# Patient Record
Sex: Female | Born: 1990 | Race: Black or African American | Hispanic: No | Marital: Single | Smoking: Never smoker
Health system: Southern US, Community
[De-identification: ages and names within clinical notes are randomized; demographics above are authoritative.]

## PROBLEM LIST (undated history)

## (undated) DIAGNOSIS — L409 Psoriasis, unspecified: Secondary | ICD-10-CM

## (undated) DIAGNOSIS — R7303 Prediabetes: Secondary | ICD-10-CM

## (undated) DIAGNOSIS — L709 Acne, unspecified: Secondary | ICD-10-CM

## (undated) DIAGNOSIS — M549 Dorsalgia, unspecified: Secondary | ICD-10-CM

## (undated) DIAGNOSIS — M76829 Posterior tibial tendinitis, unspecified leg: Secondary | ICD-10-CM

## (undated) HISTORY — PX: NO PAST SURGERIES: SHX2092

## (undated) HISTORY — DX: Posterior tibial tendinitis, unspecified leg: M76.829

## (undated) HISTORY — DX: Acne, unspecified: L70.9

## (undated) HISTORY — DX: Dorsalgia, unspecified: M54.9

## (undated) HISTORY — DX: Prediabetes: R73.03

## (undated) HISTORY — DX: Psoriasis, unspecified: L40.9

---

## 2016-06-23 ENCOUNTER — Ambulatory Visit (INDEPENDENT_AMBULATORY_CARE_PROVIDER_SITE_OTHER): Payer: 59 | Admitting: Podiatry

## 2016-06-23 ENCOUNTER — Ambulatory Visit (INDEPENDENT_AMBULATORY_CARE_PROVIDER_SITE_OTHER): Payer: 59

## 2016-06-23 ENCOUNTER — Encounter: Payer: Self-pay | Admitting: Podiatry

## 2016-06-23 DIAGNOSIS — M76829 Posterior tibial tendinitis, unspecified leg: Secondary | ICD-10-CM

## 2016-06-23 DIAGNOSIS — M2140 Flat foot [pes planus] (acquired), unspecified foot: Secondary | ICD-10-CM | POA: Diagnosis not present

## 2016-06-23 DIAGNOSIS — M779 Enthesopathy, unspecified: Secondary | ICD-10-CM

## 2016-06-23 DIAGNOSIS — M722 Plantar fascial fibromatosis: Secondary | ICD-10-CM

## 2016-06-23 NOTE — Progress Notes (Signed)
   Subjective:    Patient ID: Monica ClanKirstie Burpee, female    DOB: February 09, 1990, 26 y.o.   MRN: [id number]  HPI: 26 year old African American female presents with her father today for chief complaint of pain to her medial foot left greater than right with tenderness for the past several months. She has been seen by another local podiatrist who states that she has tendinitis prescribed anti-inflammatories and an arch pad. She has no history of trauma but does have a history of lymphedema on her mother side. She is on the state that she has some swelling bilaterally as well.    Review of Systems  All other systems reviewed and are negative.      Objective:   Physical Exam: Vital signs are stable alert and oriented 3. Pulses are palpable. Neurologic sensorium is intact. Deep tendon reflexes are intact. Muscle strength +5 over 5 dorsiflexion plantar flexors and inverters everters onto the musculatures intact. She has pain on palpation of the posterior tibial tendon as it courses beneath the medial malleolus extending to the navicular tuberosity. She has mild tenderness on palpation of the plantar fascia at the calcaneal insertion site. Otherwise orthopedic evaluation and x-rays pes planus left greater than right. Does not demonstrate any type of coalition appears to be flexible in nature radiographs confirmed this as well. No open lesions or wounds are noted.        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 Mild lymphedema bilateral more than likely genetic in nature. Mild plantar fasciitis bilateral left greater than right.  Plan: Assess the etiology pathology conservative versus surgical therapies. I expressed to her that she has posterior tibial tendinitis with a flexible flatfoot deformity which would hopefully be amenable to orthotic devices. I will have her back so that Raiford NobleRick can  cast her for orthotics probably will need a deep heel cup and a medial flange. I did not start her on any medications and will possibly do so once the orthotics come in. Should this fail to alleviate her symptoms I would consider an MRI since this appears to be long-standing.

## 2016-07-01 ENCOUNTER — Ambulatory Visit (INDEPENDENT_AMBULATORY_CARE_PROVIDER_SITE_OTHER): Payer: 59 | Admitting: Podiatry

## 2016-07-01 DIAGNOSIS — M779 Enthesopathy, unspecified: Secondary | ICD-10-CM

## 2016-07-01 DIAGNOSIS — M76829 Posterior tibial tendinitis, unspecified leg: Secondary | ICD-10-CM

## 2016-07-01 NOTE — Progress Notes (Signed)
Patient presents today upon recommedation Dr Al [facility]usHyatt [facility] tendonitis/PTTD.  Upon palpation, patient complains of tenderness distal and inferior of medial mal.    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  Goal is provide longitudinal arch support, offer RF stability.   Plan on [employer] to fab CMFO made out of co-poly based upon patients wt (140) and shoe size (7.5).   Rear foot medial skive/post 4*, and FF inversion 4*.  ALSO deep heel cup and cut shell wide.   Patient's insurance cked...will not cover F/O's.  Patient advised the $398 charge and she is putting it on her flex spend account.

## 2016-07-20 ENCOUNTER — Ambulatory Visit: Payer: 59 | Admitting: Podiatry

## 2016-07-22 ENCOUNTER — Ambulatory Visit (INDEPENDENT_AMBULATORY_CARE_PROVIDER_SITE_OTHER): Payer: Managed Care, Other (non HMO) | Admitting: Podiatry

## 2016-07-22 ENCOUNTER — Ambulatory Visit (INDEPENDENT_AMBULATORY_CARE_PROVIDER_SITE_OTHER): Payer: Managed Care, Other (non HMO)

## 2016-07-22 ENCOUNTER — Encounter: Payer: Self-pay | Admitting: Podiatry

## 2016-07-22 DIAGNOSIS — M2140 Flat foot [pes planus] (acquired), unspecified foot: Secondary | ICD-10-CM

## 2016-07-22 DIAGNOSIS — M779 Enthesopathy, unspecified: Secondary | ICD-10-CM

## 2016-07-22 DIAGNOSIS — R52 Pain, unspecified: Secondary | ICD-10-CM

## 2016-07-22 DIAGNOSIS — M76829 Posterior tibial tendinitis, unspecified leg: Secondary | ICD-10-CM

## 2016-07-22 NOTE — Progress Notes (Signed)
This patient presents the office stating that she's had swelling in her left ankle approximately one week ago.  She says that her swelling has gone down and she has had less pain and discomfort.  She says she is 80% better today but still does have pain over her left forefoot.  Patient is a patient of Dr. Al CorpusHyatt and has been diagnosed with PTT D.  She is awaiting the orthotics arrival.  She presents the office today for an evaluation and treatment of her left foot  Neurovascular status intact.  Patient has mild palpable pain noted third metatarsal left foot and third interspace of the left foot.  Patient does have a flexible pes planus foot type, consistent with the posterior tibial tendon dysfunction.  Patient also has mild hallux limitus first MPJ bilaterally.  Plantar fascial pain is elicited  Diagnosis left ankle tendinitis  plantar fasciitis  ROV  discussed condition with patient.  There is no evidence of any clinical signs for which I think I needed an x-ray.  Since this patient has been improving. I recommended she await the arrival of her orthotics and then follow-up with Dr. Al CorpusHyatt after these have been worn.  Patient was offered medication but was not interested at this time.  Compression anklet sock was dispensed for swelling.  Return to clinic when necessary   Helane GuntherGregory Ahriana Gunkel DPM

## 2016-07-23 ENCOUNTER — Ambulatory Visit: Payer: 59 | Admitting: Podiatry

## 2016-07-26 ENCOUNTER — Ambulatory Visit: Payer: Managed Care, Other (non HMO) | Admitting: *Deleted

## 2016-07-26 DIAGNOSIS — M76829 Posterior tibial tendinitis, unspecified leg: Secondary | ICD-10-CM

## 2016-07-26 NOTE — Patient Instructions (Signed)

## 2016-08-02 NOTE — Progress Notes (Signed)
Patient ID: Monica Dunlap, female   DOB: [birth year], 26 y.o.   MRN: [id number]  Patient presents for orthotic pick up.  Verbal and written break in and wear instructions given.  Patient will follow up in 4 weeks if symptoms worsen or fail to improve.

## 2016-08-23 ENCOUNTER — Ambulatory Visit (INDEPENDENT_AMBULATORY_CARE_PROVIDER_SITE_OTHER): Payer: Managed Care, Other (non HMO) | Admitting: Podiatry

## 2016-08-23 ENCOUNTER — Encounter: Payer: Self-pay | Admitting: Podiatry

## 2016-08-23 DIAGNOSIS — M2140 Flat foot [pes planus] (acquired), unspecified foot: Secondary | ICD-10-CM

## 2016-08-23 DIAGNOSIS — M722 Plantar fascial fibromatosis: Secondary | ICD-10-CM

## 2016-08-23 DIAGNOSIS — M76829 Posterior tibial tendinitis, unspecified leg: Secondary | ICD-10-CM

## 2016-08-23 NOTE — Progress Notes (Signed)
She presents today for follow-up of her posterior tibial tendon dysfunction left foot. She states this seems to be doing very well the orthotics seem to be helping they wanted to get swelling and have tingling down underneath my foot radiating out to my toes. She states that sometimes also swells particularly an long car trips.  Objective: Vital signs are stable she is alert and oriented 3 much decrease in erythema and edema to the left foot. She has mild tenderness on palpation posterior tibial tendon no pain on palpation of the plantar fascial calcaneal insertion site.  Assessment pes planus with posterior tibial tendon dysfunction left.  Plan: I highly recommend she continue wearing her orthotics on a regular basis. I discussed with her shoe types today and I'll follow-up with her on an as-needed basis.

## 2017-05-05 ENCOUNTER — Encounter: Payer: Self-pay | Admitting: Podiatry

## 2017-05-05 ENCOUNTER — Ambulatory Visit: Payer: Managed Care, Other (non HMO) | Admitting: Podiatry

## 2017-05-05 DIAGNOSIS — M76829 Posterior tibial tendinitis, unspecified leg: Secondary | ICD-10-CM

## 2017-05-05 MED ORDER — MELOXICAM 15 MG PO TABS: 15.0000 mg | ORAL_TABLET | Freq: Every day | ORAL | 1 refills | Status: DC

## 2017-05-05 NOTE — Progress Notes (Signed)
This patient presents the office follow-up for  tibial tendon dysfunction, left foot.  She says she aggravated this taking of road trip approximately 6 days ago.  She said she experiences significant pain and throbbing during the car trip.  She says she faithfully wears her orthotics in her shoes when she walks.  She says she did not wear her orthotics during her 6 road trip.  Neurovascular status is intact.  Pain noted along the course of the posterior tibial tendon under the medial malleolus left foot.  Minimal swelling noted.    PTTD left foot.  ROV.  Patient was told to continue to use her orthotics, especially during trips.  Prescribed Mobic 15 mg #30 with instructions to take 1 daily.  RTC prn.   Helane GuntherGregory Alexey Rhoads DPM

## 2017-05-20 ENCOUNTER — Telehealth: Payer: Self-pay | Admitting: Podiatry

## 2017-05-20 NOTE — Telephone Encounter (Signed)
The medication that Dr. Stacie Acres prescribed is not helping. I should have seen Dr. Al Corpus instead of Dr. Stacie Acres and I was wondering if Dr. Al Corpus could prescribe me something else? You can reach me at [phone].

## 2017-05-25 NOTE — Telephone Encounter (Signed)
I returned patient call, but unable to leave voice mail.  Voice mail not set up

## 2017-06-29 ENCOUNTER — Ambulatory Visit: Payer: BC Managed Care – PPO | Admitting: Podiatry

## 2017-06-29 ENCOUNTER — Encounter: Payer: Self-pay | Admitting: Podiatry

## 2017-06-29 DIAGNOSIS — M76822 Posterior tibial tendinitis, left leg: Secondary | ICD-10-CM | POA: Diagnosis not present

## 2017-06-29 DIAGNOSIS — M76829 Posterior tibial tendinitis, unspecified leg: Secondary | ICD-10-CM

## 2017-06-29 MED ORDER — METHYLPREDNISOLONE 4 MG PO TBPK: ORAL_TABLET | ORAL | 0 refills | Status: DC

## 2017-06-29 NOTE — Progress Notes (Signed)
She presents today for follow-up of her posterior tibial tendon dysfunction left foot.  States that is doing okay is to have a little annoying tingling and burning right in here she points to the posterior tibial tendon inframalleolar region.  States that the meloxicam really did not help her much at all.  Objective: Vital signs are stable she is alert and oriented x3.  Pulses are palpable.  She has mild tenderness on palpation of the posterior tibial tendon left.  There appears to be some fluctuance at the inframalleolar region.  She has good normal function of the tendon itself.  Assessment: Posterior tibial tendinitis left.  Plan: At this point I injected 2 mg of dexamethasone and local anesthetic after sterile Betadine skin prep to the medial aspect of the left ankle.  Procedure well without complications.  Start her on a Medrol Dosepak.  We will follow-up with her on an as-needed basis.  Did recommend highly that she continue to wear her orthotics when she asked me about sandals I instructed her to purchase a pair of sandals with a deep heel cup and a higher arch.

## 2017-07-03 ENCOUNTER — Other Ambulatory Visit: Payer: Self-pay | Admitting: Podiatry

## 2018-07-26 ENCOUNTER — Encounter: Payer: Self-pay | Admitting: Podiatry

## 2018-07-26 ENCOUNTER — Other Ambulatory Visit: Payer: Self-pay

## 2018-07-26 ENCOUNTER — Ambulatory Visit: Payer: BC Managed Care – PPO | Admitting: Podiatry

## 2018-07-26 VITALS — Temp 97.7°F

## 2018-07-26 DIAGNOSIS — M76829 Posterior tibial tendinitis, unspecified leg: Secondary | ICD-10-CM

## 2018-07-26 DIAGNOSIS — M76822 Posterior tibial tendinitis, left leg: Secondary | ICD-10-CM

## 2018-07-26 NOTE — Progress Notes (Signed)
She presents today for follow-up of her posterior tibial tendinitis states that every summer starts to bother me and I am leaving for the beach this weekend.  Objective: Vital signs are stable she alert and oriented x3.  She has fluctuance on palpation of the posterior tibial tendon of the left ankle.  Still has full function does not appear to be a tear.  Assessment: Pes planus posterior tibial tendon tendinitis.  Plan: Injected the area today with Kenalog and local anesthetic.

## 2018-09-22 ENCOUNTER — Encounter: Payer: Self-pay | Admitting: Emergency Medicine

## 2018-09-22 ENCOUNTER — Other Ambulatory Visit: Payer: Self-pay

## 2018-09-22 ENCOUNTER — Ambulatory Visit
Admission: EM | Admit: 2018-09-22 | Discharge: 2018-09-22 | Disposition: A | Discharge status: Home / Self Care | Payer: BC Managed Care – PPO | Attending: Emergency Medicine | Admitting: Emergency Medicine

## 2018-09-22 DIAGNOSIS — M779 Enthesopathy, unspecified: Secondary | ICD-10-CM

## 2018-09-22 DIAGNOSIS — M778 Other enthesopathies, not elsewhere classified: Secondary | ICD-10-CM

## 2018-09-22 MED ORDER — IBUPROFEN 600 MG PO TABS: 600.0000 mg | ORAL_TABLET | Freq: Four times a day (QID) | ORAL | 0 refills | Status: DC | PRN

## 2018-09-22 MED ORDER — PREDNISONE 10 MG (21) PO TBPK: ORAL_TABLET | ORAL | 0 refills | Status: DC

## 2018-09-22 NOTE — Discharge Instructions (Addendum)
6-day prednisone Dosepak, 600 mg of ibuprofen combined with 1 g of Tylenol 3-4 times a day on a regular basis for the next several days, then as needed.  Try and rest your hand as much as you can.  If not better in a week or 2, follow-up with Dr. Peggye Ley, hand surgeon.

## 2018-09-22 NOTE — ED Provider Notes (Signed)
HPI  SUBJECTIVE:  Monica Dunlap is a right-handed 28 y.o. female who presents with constant daily pain in the webspace between her thumb and index finger, which is worse in the morning.  This is been going on intermittently for the past 6 months but has become constant and daily over the past week she reports decreased grip strength secondary to pain.  Denies true weakness.  Denies numbness, tingling, erythema, bruising, swelling.  She is a Pharmacist, hospital.  She denies any repetitive motions.  She does not recall any trauma.  She has not been lifting weights sleeping on her hand.  It does not wake her up at night.  She has never had symptoms like this before.  She is tried ibuprofen 400 mg twice daily with some improvement in her symptoms.  She also tried rest.  Symptoms are worse in the morning and with opening jars, doors, twisting.  Past medical history negative for diabetes, hand injury, neuropathy.  LMP: 8/26.  Denies the possibility of being pregnant.  PMD: Dr. Iona Beard at [facility]    History reviewed. No pertinent past medical history.  History reviewed. No pertinent surgical history.  Family History  Problem Relation Age of Onset  . Healthy Mother   . Healthy Father     Social History   Tobacco Use  . Smoking status: Never Smoker  . Smokeless tobacco: Never Used  Substance Use Topics  . Alcohol use: Yes  . Drug use: Never    No current facility-administered medications for this encounter.   Current Outpatient Medications:  .  norethindrone-ethinyl estradiol (JUNEL FE,GILDESS FE,LOESTRIN FE) 1-20 MG-MCG tablet, Take 1 tablet by mouth daily., Disp: , Rfl:  .  ibuprofen (ADVIL) 600 MG tablet, Take 1 tablet (600 mg total) by mouth every 6 (six) hours as needed., Disp: 30 tablet, Rfl: 0 .  predniSONE (STERAPRED UNI-PAK 21 TAB) 10 MG (21) TBPK tablet, Dispense one 6 day pack. Take as directed with food., Disp: 21 tablet, Rfl: 0  No Known Allergies   ROS  As noted in HPI.    Physical Exam  BP (!) 136/91 (BP Location: Right Arm)   Pulse 78   Temp 98.9 F (37.2 C) (Oral)   Resp 14   Ht 5\' 5"  (1.651 m)   Wt 62.6 kg   LMP 09/06/2018 (Exact Date)   SpO2 100%   BMI 22.96 kg/m   Constitutional: Well developed, well nourished, no acute distress Eyes:  EOMI, conjunctiva normal bilaterally HENT: Normocephalic, atraumatic,mucus membranes moist Respiratory: Normal inspiratory effort Cardiovascular: Normal rate GI: nondistended skin: No rash, skin intact Musculoskeletal: Right hand: Palm normal appearance no swelling, erythema, increased temperature, bruising.  Positive tenderness along the flexor tendon of the index finger.  No tenderness along the webspace.  No pain with passive extension of the finger.  Mild pain at the MCP joint.  No erythema, swelling at the joint.   Baseline Strength and Sensation  with normal light touch intact for Pt, distal motor and sensation in median/radial/ulnar nerve distribution with CR< 2 secs and pulse intact.  Hand with intact motor strength 5/5 flexion / extension / FDP / FDS against resistance and 2-point discrimination intact at 13mm in all fingers. Skin intact. No signs of trauma.  No palpable cord suggestive of Duptreyen's contracture.  Wrist WNL, nontender.  Finkelstein negative.  No pain with full range of motion of the wrist. Neurologic: Alert & oriented x 3, no focal neuro deficits Psychiatric: Speech and behavior appropriate  ED Course   Medications - No data to display  No orders of the defined types were placed in this encounter.   No results found for this or any previous visit (from the past 24 hour(s)). No results found.  ED Clinical Impression  1. Tendonitis of right hand      ED Assessment/Plan Suspect a tendinitis.  No evidence of infection/tenosynovitis.  Deferring imaging because she has no bony tenderness of the second metacarpal along the top of the hand, tenderness rather seems to be along the  flexor tendon.  Does not appear to be torn. .  Will try a 6-day prednisone Dosepak, 600 mg of ibuprofen combined with 1 g of Tylenol 3-4 times a day on a regular basis for the next several days, then as needed.  If not better in a week or 2, follow-up with Dr. Stephenie AcresSoria, hand surgeon.  Discussed MDM, treatment plan, and plan for follow-up with patient patient agrees with plan.   Meds ordered this encounter  Medications  . predniSONE (STERAPRED UNI-PAK 21 TAB) 10 MG (21) TBPK tablet    Sig: Dispense one 6 day pack. Take as directed with food.    Dispense:  21 tablet    Refill:  0  . ibuprofen (ADVIL) 600 MG tablet    Sig: Take 1 tablet (600 mg total) by mouth every 6 (six) hours as needed.    Dispense:  30 tablet    Refill:  0    *This clinic note was created using Scientist, clinical (histocompatibility and immunogenetics)Dragon dictation software. Therefore, there may be occasional mistakes despite careful proofreading.   ?    Domenick GongMortenson, Draken Farrior, MD 09/22/18 1735

## 2018-09-22 NOTE — ED Triage Notes (Signed)
Patient c/o pain and weakness in her right hand off and on for 6 months.  Patient denies injury or fall.

## 2019-08-09 ENCOUNTER — Ambulatory Visit
Admission: RE | Admit: 2019-08-09 | Discharge: 2019-08-09 | Disposition: A | Discharge status: Home / Self Care | Payer: BC Managed Care – PPO | Source: Ambulatory Visit | Attending: Family Medicine | Admitting: Family Medicine

## 2019-08-09 ENCOUNTER — Other Ambulatory Visit: Payer: Self-pay

## 2019-08-09 VITALS — BP 143/101 | HR 99 | Temp 98.3°F | Resp 16 | Ht 65.0 in | Wt 130.0 lb

## 2019-08-09 DIAGNOSIS — K219 Gastro-esophageal reflux disease without esophagitis: Secondary | ICD-10-CM

## 2019-08-09 DIAGNOSIS — R112 Nausea with vomiting, unspecified: Secondary | ICD-10-CM | POA: Diagnosis not present

## 2019-08-09 LAB — COMPREHENSIVE METABOLIC PANEL
ALT: 11 U/L (ref 0–44)
AST: 17 U/L (ref 15–41)
Albumin: 4.5 g/dL (ref 3.5–5.0)
Alkaline Phosphatase: 42 U/L (ref 38–126)
Anion gap: 7 (ref 5–15)
BUN: 10 mg/dL (ref 6–20)
CO2: 26 mmol/L (ref 22–32)
Calcium: 8.9 mg/dL (ref 8.9–10.3)
Chloride: 107 mmol/L (ref 98–111)
Creatinine, Ser: 0.73 mg/dL (ref 0.44–1.00)
GFR calc Af Amer: >60 mL/min (ref >60)
GFR calc non Af Amer: >60 mL/min (ref >60)
Glucose, Bld: 85 mg/dL (ref 70–99)
Potassium: 3.7 mmol/L (ref 3.5–5.1)
Sodium: 140 mmol/L (ref 135–145)
Total Bilirubin: 0.8 mg/dL (ref 0.3–1.2)
Total Protein: 7.8 g/dL (ref 6.5–8.1)

## 2019-08-09 LAB — CBC WITH DIFFERENTIAL/PLATELET
Abs Immature Granulocytes: 0.02 10*3/uL (ref 0.00–0.07)
Basophils Absolute: 0 10*3/uL (ref 0.0–0.1)
Basophils Relative: 0 %
Eosinophils Absolute: 0.3 10*3/uL (ref 0.0–0.5)
Eosinophils Relative: 4 %
HCT: 35 % — ABNORMAL LOW (ref 36.0–46.0)
Hemoglobin: 11.9 g/dL — ABNORMAL LOW (ref 12.0–15.0)
Immature Granulocytes: 0 %
Lymphocytes Relative: 42 %
Lymphs Abs: 3.2 10*3/uL (ref 0.7–4.0)
MCH: 28.4 pg (ref 26.0–34.0)
MCHC: 34 g/dL (ref 30.0–36.0)
MCV: 83.5 fL (ref 80.0–100.0)
Monocytes Absolute: 0.6 10*3/uL (ref 0.1–1.0)
Monocytes Relative: 8 %
Neutro Abs: 3.6 10*3/uL (ref 1.7–7.7)
Neutrophils Relative %: 46 %
Platelets: 309 10*3/uL (ref 150–400)
RBC: 4.19 MIL/uL (ref 3.87–5.11)
RDW: 13.3 % (ref 11.5–15.5)
WBC: 7.7 10*3/uL (ref 4.0–10.5)
nRBC: 0 % (ref 0.0–0.2)

## 2019-08-09 LAB — PREGNANCY, URINE: Preg Test, Ur: NEGATIVE

## 2019-08-09 LAB — LIPASE, BLOOD: Lipase: 36 U/L (ref 11–51)

## 2019-08-09 MED ORDER — PANTOPRAZOLE SODIUM 40 MG PO TBEC: 40.0000 mg | DELAYED_RELEASE_TABLET | Freq: Every day | ORAL | 0 refills | Status: DC

## 2019-08-09 MED ORDER — ONDANSETRON HCL 4 MG PO TABS: 4.0000 mg | ORAL_TABLET | Freq: Three times a day (TID) | ORAL | 0 refills | Status: DC | PRN

## 2019-08-09 NOTE — Discharge Instructions (Signed)
Medication as prescribed.  Slow introduction of normal foods.  Take care  Dr. Adriana Simas

## 2019-08-09 NOTE — ED Triage Notes (Signed)
Pt c/o n/v that began on Sunday. Reports after eating she feels like her stomach is not settling. While eating continues belching. BM has been "brown/yellowish, not normal looking".

## 2019-08-09 NOTE — ED Provider Notes (Signed)
[facility]    CSN: [id number] Arrival date & time: 08/09/19  1611  History   Chief Complaint Chief Complaint  Patient presents with  . Abdominal Pain   HPI  29 year old female presents with the above complaint.  Patient reports that she has had some upper abdominal discomfort and feels like her stomach is uneasy.  This started on Sunday after she had 1 bout of emesis.  She reports ongoing she has felt okay but continues to have symptoms particularly after she eats.  She states that her bowel movements have been abnormal.  She states that they are abnormally colored but she has no diarrhea or constipation.  Reports her pain is 2/10 in severity.  No relieving factors.  She has been taking Tums without resolution.  No other associated symptoms.  No other complaints.  Home Medications    Prior to Admission medications   Medication Sig Start Date End Date Taking? Authorizing Provider  ibuprofen (ADVIL) 600 MG tablet Take 1 tablet (600 mg total) by mouth every 6 (six) hours as needed. 09/22/18   Domenick Gong, MD  norethindrone-ethinyl estradiol (JUNEL FE,GILDESS FE,LOESTRIN FE) 1-20 MG-MCG tablet Take 1 tablet by mouth daily.    [provider]  ondansetron (ZOFRAN) 4 MG tablet Take 1 tablet (4 mg total) by mouth every 8 (eight) hours as needed for nausea or vomiting. 08/09/19   Tommie Sams, DO  pantoprazole (PROTONIX) 40 MG tablet Take 1 tablet (40 mg total) by mouth daily. 08/09/19   Tommie Sams, DO  predniSONE (STERAPRED UNI-PAK 21 TAB) 10 MG (21) TBPK tablet Dispense one 6 day pack. Take as directed with food. 09/22/18   Domenick Gong, MD  azelastine (ASTELIN) 0.1 % nasal spray USE 1 SPRAY IN BOTH NOSTRILS TWICE A DAY 05/24/16 09/22/18  [provider]    Family History Family History  Problem Relation Age of Onset  . Healthy Mother   . Healthy Father     Social History Social History   Tobacco Use  . Smoking status: Never Smoker  .  Smokeless tobacco: Never Used  Vaping Use  . Vaping Use: Never used  Substance Use Topics  . Alcohol use: Yes  . Drug use: Never     Allergies   Patient has no known allergies.   Review of Systems Review of Systems Per HPI  Physical Exam Triage Vital Signs ED Triage Vitals  Enc Vitals Group     BP 08/09/19 1634 (!) 143/101     Pulse Rate 08/09/19 1634 99     Resp 08/09/19 1634 16     Temp 08/09/19 1634 98.3 F (36.8 C)     Temp src --      SpO2 08/09/19 1634 99 %     Weight 08/09/19 1635 130 lb (59 kg)     Height 08/09/19 1635 5\' 5"  (1.651 m)     Head Circumference --      Peak Flow --      Pain Score 08/09/19 1634 2     Pain Loc --      Pain Edu? --      Excl. in GC? --    Updated Vital Signs BP (!) 143/101   Pulse 99   Temp 98.3 F (36.8 C)   Resp 16   Ht 5\' 5"  (1.651 m)   Wt 59 kg   LMP 07/18/2019 (Exact Date)   SpO2 99%   BMI 21.63 kg/m   Visual Acuity Right  Eye Distance:   Left Eye Distance:   Bilateral Distance:    Right Eye Near:   Left Eye Near:    Bilateral Near:     Physical Exam Vitals and nursing note reviewed.  Constitutional:      General: She is not in acute distress.    Appearance: Normal appearance. She is not ill-appearing.  HENT:     Head: Normocephalic and atraumatic.  Eyes:     General:        Right eye: No discharge.        Left eye: No discharge.     Conjunctiva/sclera: Conjunctivae normal.  Cardiovascular:     Rate and Rhythm: Normal rate and regular rhythm.     Heart sounds: No murmur heard.   Pulmonary:     Effort: Pulmonary effort is normal.     Breath sounds: Normal breath sounds. No wheezing, rhonchi or rales.  Abdominal:     General: There is no distension.     Palpations: Abdomen is soft.     Tenderness: There is no abdominal tenderness.  Neurological:     Mental Status: She is alert.  Psychiatric:        Mood and Affect: Mood normal.        Behavior: Behavior normal.    UC Treatments / Results    Labs (all labs ordered are listed, but only abnormal results are displayed) Labs Reviewed  CBC WITH DIFFERENTIAL/PLATELET - Abnormal; Notable for the following components:      Result Value   Hemoglobin 11.9 (*)    HCT 35.0 (*)    All other components within normal limits  COMPREHENSIVE METABOLIC PANEL  LIPASE, BLOOD  PREGNANCY, URINE    EKG   Radiology No results found.  Procedures Procedures (including critical care time)  Medications Ordered in UC Medications - No data to display  Initial Impression / Assessment and Plan / UC Course  I have reviewed the triage vital signs and the nursing notes.  Pertinent labs & imaging results that were available during my care of the patient were reviewed by me and considered in my medical decision making (see chart for details).    29 year old female presents with nausea and abdominal pain.  Patient complains primarily that her stomach is uneasy.  She has a lot of belching after she eats.  Suspect GERD.  Treating with Protonix.  Zofran as needed for nausea.  Labs unremarkable today.  Final Clinical Impressions(s) / UC Diagnoses   Final diagnoses:  Non-intractable vomiting with nausea, unspecified vomiting type  Gastroesophageal reflux disease without esophagitis     Discharge Instructions     Medication as prescribed.  Slow introduction of normal foods.  Take care  Dr. Adriana Simas    ED Prescriptions    Medication Sig Dispense Auth. Provider   pantoprazole (PROTONIX) 40 MG tablet Take 1 tablet (40 mg total) by mouth daily. 30 tablet Traniyah Hallett G, DO   ondansetron (ZOFRAN) 4 MG tablet Take 1 tablet (4 mg total) by mouth every 8 (eight) hours as needed for nausea or vomiting. 20 tablet Tommie Sams, DO     PDMP not reviewed this encounter.   Tommie Sams, [place] 08/09/19 2101

## 2019-09-01 ENCOUNTER — Other Ambulatory Visit: Payer: Self-pay | Admitting: Family Medicine

## 2019-10-16 ENCOUNTER — Other Ambulatory Visit: Payer: Self-pay

## 2019-10-16 ENCOUNTER — Encounter: Payer: Self-pay | Admitting: Podiatry

## 2019-10-16 ENCOUNTER — Ambulatory Visit: Payer: BC Managed Care – PPO | Admitting: Podiatry

## 2019-10-16 DIAGNOSIS — M76822 Posterior tibial tendinitis, left leg: Secondary | ICD-10-CM

## 2019-10-16 MED ORDER — METHYLPREDNISOLONE 4 MG PO TBPK: ORAL_TABLET | ORAL | 0 refills | Status: DC

## 2019-10-17 NOTE — Progress Notes (Signed)
She presents today states that she could not get any sooner due to her own schedule but is been hurting as far as her posterior tibial tendon she is referring to of her left foot she is tried wearing a brace taking naproxen with some help.  She been wearing her orthotics again.  Objective: Vital signs are stable she is alert and oriented x3.  She has palpable fluctuance within the tendon sheath of the posterior tibial tendon left.  Otherwise she has good inversion against resistance with just mild tenderness.  Peroneal group appears to be normal.  No plantar fasciitis.  Assessment: Posterior tibial tendinitis.  Plan: I injected the area today with 10 mg of Kenalog making sure not to inject into the tendon itself.  I did inject into the tendon sheath and patient noted that he was feeling better prior to leaving the office.  Also started her on a Medrol Dosepak and I will follow-up with her on an as-needed basis.  We may need to consider an MRI if this does not resolve.  She will start back in orthotics since possible.

## 2020-06-25 LAB — HM PAP SMEAR: HM Pap smear: ABNORMAL

## 2021-02-07 ENCOUNTER — Ambulatory Visit
Admission: EM | Admit: 2021-02-07 | Discharge: 2021-02-07 | Disposition: A | Discharge status: Home / Self Care | Payer: BC Managed Care – PPO | Attending: Emergency Medicine | Admitting: Emergency Medicine

## 2021-02-07 ENCOUNTER — Encounter: Payer: Self-pay | Admitting: Emergency Medicine

## 2021-02-07 DIAGNOSIS — J0100 Acute maxillary sinusitis, unspecified: Secondary | ICD-10-CM | POA: Diagnosis not present

## 2021-02-07 MED ORDER — AMOXICILLIN 875 MG PO TABS: 875.0000 mg | ORAL_TABLET | Freq: Two times a day (BID) | ORAL | 0 refills | Status: AC

## 2021-02-07 NOTE — ED Triage Notes (Signed)
Pt here with a 3 week hx of sinus and head congestion and post-nasal drainage. Has tried OTC therapies with no relief.

## 2021-02-07 NOTE — ED Provider Notes (Signed)
Renaldo Fiddler    CSN: [id number] Arrival date & time: 02/07/21  0850      History   Chief Complaint Chief Complaint  Patient presents with   Nasal Congestion   Headache    HPI Monica Dunlap is a 31 y.o. female.  Patient presents with 3-week history of sinus congestion, sinus pressure, postnasal drip, runny nose, cough.  No fever, rash, shortness of breath, vomiting, diarrhea, or other symptoms.  Multiple OTC sinus medications attempted without relief.  The history is provided by the patient.   History reviewed. No pertinent past medical history.  There are no problems to display for this patient.   History reviewed. No pertinent surgical history.  OB History   No obstetric history on file.      Home Medications    Prior to Admission medications   Medication Sig Start Date End Date Taking? Authorizing Provider  amoxicillin (AMOXIL) 875 MG tablet Take 1 tablet (875 mg total) by mouth 2 (two) times daily for 10 days. 02/07/21 02/17/21 Yes Mickie Bail, NP  Adalimumab (HUMIRA PEN) 40 MG/0.4ML PNKT  08/06/19   [provider]  HUMIRA PEN-PSOR/UVEIT STARTER 80 MG/0.8ML & 40MG /0.4ML PNKT Inject into the skin. 07/11/19   [provider]  ibuprofen (ADVIL) 600 MG tablet Take 1 tablet (600 mg total) by mouth every 6 (six) hours as needed. 09/22/18   11/22/18, MD  levonorgestrel (LILETTA, 52 MG,) 19.5 MCG/DAY IUD IUD by Intrauterine route.    [provider]  methylPREDNISolone (MEDROL DOSEPAK) 4 MG TBPK tablet 6 day dose pack - take as directed 10/16/19   Hyatt, Max T, DPM  norethindrone-ethinyl estradiol (JUNEL FE,GILDESS FE,LOESTRIN FE) 1-20 MG-MCG tablet Take 1 tablet by mouth daily.    [provider]  ondansetron (ZOFRAN) 4 MG tablet Take 1 tablet (4 mg total) by mouth every 8 (eight) hours as needed for nausea or vomiting. 08/09/19   08/11/19, DO  pantoprazole (PROTONIX) 40 MG tablet Take 1 tablet (40 mg total) by mouth  daily. 08/09/19   08/11/19, DO  azelastine (ASTELIN) 0.1 % nasal spray USE 1 SPRAY IN BOTH NOSTRILS TWICE A DAY 05/24/16 09/22/18  [provider]    Family History Family History  Problem Relation Age of Onset   Healthy Mother    Healthy Father     Social History Social History   Tobacco Use   Smoking status: Never   Smokeless tobacco: Never  Vaping Use   Vaping Use: Never used  Substance Use Topics   Alcohol use: Yes   Drug use: Never     Allergies   Patient has no known allergies.   Review of Systems Review of Systems  Constitutional:  Negative for chills and fever.  HENT:  Positive for congestion, postnasal drip, rhinorrhea and sinus pressure. Negative for ear pain and sore throat.   Respiratory:  Positive for cough. Negative for shortness of breath.   Cardiovascular:  Negative for chest pain and palpitations.  Gastrointestinal:  Negative for diarrhea and vomiting.  Skin:  Negative for color change and rash.  All other systems reviewed and are negative.   Physical Exam Triage Vital Signs ED Triage Vitals [02/07/21 0909]  Enc Vitals Group     BP (!) 133/92     Pulse Rate 83     Resp 18     Temp 98.9 F (37.2 C)     Temp Source Oral     SpO2 99 %  Weight      Height      Head Circumference      Peak Flow      Pain Score      Pain Loc      Pain Edu?      Excl. in GC?    No data found.  Updated Vital Signs BP (!) 133/92 (BP Location: Left Arm)    Pulse 83    Temp 98.9 F (37.2 C) (Oral)    Resp 18    SpO2 99%   Visual Acuity Right Eye Distance:   Left Eye Distance:   Bilateral Distance:    Right Eye Near:   Left Eye Near:    Bilateral Near:     Physical Exam Vitals and nursing note reviewed.  Constitutional:      General: She is not in acute distress.    Appearance: She is well-developed.  HENT:     Right Ear: Tympanic membrane normal.     Left Ear: Tympanic membrane normal.     Nose: Congestion present.      Mouth/Throat:     Mouth: Mucous membranes are moist.     Pharynx: Oropharynx is clear.  Cardiovascular:     Rate and Rhythm: Normal rate and regular rhythm.     Heart sounds: Normal heart sounds.  Pulmonary:     Effort: Pulmonary effort is normal. No respiratory distress.     Breath sounds: Normal breath sounds.  Musculoskeletal:     Cervical back: Neck supple.  Skin:    General: Skin is warm and dry.  Neurological:     Mental Status: She is alert.  Psychiatric:        Mood and Affect: Mood normal.        Behavior: Behavior normal.     UC Treatments / Results  Labs (all labs ordered are listed, but only abnormal results are displayed) Labs Reviewed - No data to display  EKG   Radiology No results found.  Procedures Procedures (including critical care time)  Medications Ordered in UC Medications - No data to display  Initial Impression / Assessment and Plan / UC Course  I have reviewed the triage vital signs and the nursing notes.  Pertinent labs & imaging results that were available during my care of the patient were reviewed by me and considered in my medical decision making (see chart for details).    Acute sinusitis.  Patient has been symptomatic for 3 weeks and is not improving with OTC treatment.  Treating today with amoxicillin.  Discussed Mucinex and ibuprofen.  Instructed her to follow-up with her PCP if she is not improving.  She agrees to plan of care.  Final Clinical Impressions(s) / UC Diagnoses   Final diagnoses:  Acute non-recurrent maxillary sinusitis     Discharge Instructions      Take the amoxicillin as directed.  Follow up with your primary care provider if your symptoms are not improving.        ED Prescriptions     Medication Sig Dispense Auth. Provider   amoxicillin (AMOXIL) 875 MG tablet Take 1 tablet (875 mg total) by mouth 2 (two) times daily for 10 days. 20 tablet Mickie Bail, NP      PDMP not reviewed this encounter.    Mickie Bail, NP 02/07/21 1000

## 2021-02-07 NOTE — Discharge Instructions (Addendum)
Take the amoxicillin as directed.  Follow up with your primary care provider if your symptoms are not improving.   ° ° °

## 2021-03-18 ENCOUNTER — Encounter: Payer: Self-pay | Admitting: Internal Medicine

## 2021-03-18 ENCOUNTER — Ambulatory Visit: Payer: BC Managed Care – PPO | Admitting: Internal Medicine

## 2021-03-18 ENCOUNTER — Other Ambulatory Visit: Payer: Self-pay

## 2021-03-18 VITALS — BP 132/80 | HR 85 | Temp 98.4°F | Ht 64.57 in | Wt 137.8 lb

## 2021-03-18 DIAGNOSIS — Z13220 Encounter for screening for lipoid disorders: Secondary | ICD-10-CM

## 2021-03-18 DIAGNOSIS — E559 Vitamin D deficiency, unspecified: Secondary | ICD-10-CM

## 2021-03-18 DIAGNOSIS — Z1329 Encounter for screening for other suspected endocrine disorder: Secondary | ICD-10-CM

## 2021-03-18 DIAGNOSIS — E538 Deficiency of other specified B group vitamins: Secondary | ICD-10-CM

## 2021-03-18 DIAGNOSIS — Z0184 Encounter for antibody response examination: Secondary | ICD-10-CM

## 2021-03-18 DIAGNOSIS — R202 Paresthesia of skin: Secondary | ICD-10-CM

## 2021-03-18 DIAGNOSIS — Z1389 Encounter for screening for other disorder: Secondary | ICD-10-CM

## 2021-03-18 DIAGNOSIS — L409 Psoriasis, unspecified: Secondary | ICD-10-CM

## 2021-03-18 DIAGNOSIS — R7303 Prediabetes: Secondary | ICD-10-CM

## 2021-03-18 DIAGNOSIS — R200 Anesthesia of skin: Secondary | ICD-10-CM

## 2021-03-18 DIAGNOSIS — Z113 Encounter for screening for infections with a predominantly sexual mode of transmission: Secondary | ICD-10-CM

## 2021-03-18 DIAGNOSIS — T148XXA Other injury of unspecified body region, initial encounter: Secondary | ICD-10-CM

## 2021-03-18 DIAGNOSIS — M2550 Pain in unspecified joint: Secondary | ICD-10-CM

## 2021-03-18 DIAGNOSIS — M7752 Other enthesopathy of left foot: Secondary | ICD-10-CM

## 2021-03-18 NOTE — Progress Notes (Signed)
Chief Complaint  ?Patient presents with  ? Establish Care  ? ?New patient  ?1. H/o psoriasis on skyrizi x 1 year 4th dose sees Dr. Evorn Gong has PSA in scalp overall controlled but on left hand middle and left ring finger x 6 months to 1 year but worsening increased freq getting cramping in hand and then blue spot and bruising and sore and hard to open/grip items in the am. She also has numbness and tingling in this area of hand as well joints affected MCP and PCP joint these fingers  ?No heavy menses  ? ?Review of Systems  ?Constitutional:  Negative for weight loss.  ?HENT:  Negative for hearing loss.   ?Eyes:  Negative for blurred vision.  ?Respiratory:  Negative for shortness of breath.   ?Cardiovascular:  Negative for chest pain.  ?Gastrointestinal:  Negative for abdominal pain and blood in stool.  ?Genitourinary:  Negative for dysuria.  ?Musculoskeletal:  Negative for falls and joint pain.  ?Skin:  Negative for rash.  ?Neurological:  Negative for headaches.  ?Psychiatric/Behavioral:  Negative for depression.   ?Past Medical History:  ?Diagnosis Date  ? Acne   ? Back pain   ? Prediabetes   ? Psoriasis   ? PTTD (posterior tibial tendon dysfunction)   ? ?Past Surgical History:  ?Procedure Laterality Date  ? NO PAST SURGERIES    ? ?Family History  ?Problem Relation Age of Onset  ? Hypertension Mother   ? Healthy Mother   ? Hypertension Father   ? Healthy Father   ? Cancer Maternal Grandmother   ?     not a smoker  ? Cancer Paternal Grandmother   ?     multiple myeloma  ? ?Social History  ? ?Socioeconomic History  ? Marital status: Single  ?  Spouse name: Not on file  ? Number of children: Not on file  ? Years of education: Not on file  ? Highest education level: Not on file  ?Occupational History  ? Not on file  ?Tobacco Use  ? Smoking status: Never  ? Smokeless tobacco: Never  ?Vaping Use  ? Vaping Use: Never used  ?Substance and Sexual Activity  ? Alcohol use: Yes  ? Drug use: Never  ? Sexual activity: Yes  ?   Partners: Male  ?Other Topics Concern  ? Not on file  ?Social History Narrative  ? Kindergarden teacher   ? ?Social Determinants of Health  ? ?Financial Resource Strain: Not on file  ?Food Insecurity: Not on file  ?Transportation Needs: Not on file  ?Physical Activity: Not on file  ?Stress: Not on file  ?Social Connections: Not on file  ?Intimate Partner Violence: Not on file  ? ?Current Meds  ?Medication Sig  ? levonorgestrel (LILETTA, 52 MG,) 19.5 MCG/DAY IUD IUD by Intrauterine route.  ? SKYRIZI PEN 150 MG/ML SOAJ Inject into the skin.  ? ?No Known Allergies ?No results found for this or any previous visit (from the past 2160 hour(s)). ?Objective  ?Body mass index is 23.24 kg/m?. ?Wt Readings from Last 3 Encounters:  ?03/18/21 137 lb 12.8 oz (62.5 kg)  ?08/09/19 130 lb (59 kg)  ?09/22/18 138 lb (62.6 kg)  ? ?Temp Readings from Last 3 Encounters:  ?03/18/21 98.4 ?F (36.9 ?C) (Oral)  ?02/07/21 98.9 ?F (37.2 ?C) (Oral)  ?08/09/19 98.3 ?F (36.8 ?C)  ? ?BP Readings from Last 3 Encounters:  ?03/18/21 132/80  ?02/07/21 (!) 133/92  ?08/09/19 (!) 143/101  ? ?Pulse Readings from Last 3  Encounters:  ?03/18/21 85  ?02/07/21 83  ?08/09/19 99  ? ? ?Physical Exam ?Vitals and nursing note reviewed.  ?Constitutional:   ?   Appearance: Normal appearance. She is well-developed and well-groomed.  ?HENT:  ?   Head: Normocephalic and atraumatic.  ?Eyes:  ?   Conjunctiva/sclera: Conjunctivae normal.  ?   Pupils: Pupils are equal, round, and reactive to light.  ?Cardiovascular:  ?   Rate and Rhythm: Normal rate and regular rhythm.  ?   Heart sounds: Normal heart sounds. No murmur heard. ?Pulmonary:  ?   Effort: Pulmonary effort is normal.  ?   Breath sounds: Normal breath sounds.  ?Abdominal:  ?   General: Abdomen is flat. Bowel sounds are normal.  ?   Tenderness: There is no abdominal tenderness.  ?Musculoskeletal:     ?   General: No tenderness.  ?Skin: ?   General: Skin is warm and dry.  ?Neurological:  ?   General: No focal deficit  present.  ?   Mental Status: She is alert and oriented to person, place, and time. Mental status is at baseline.  ?   Cranial Nerves: Cranial nerves 2-12 are intact.  ?   Motor: Motor function is intact.  ?   Coordination: Coordination is intact.  ?   Gait: Gait is intact.  ?Psychiatric:     ?   Attention and Perception: Attention and perception normal.     ?   Mood and Affect: Mood and affect normal.     ?   Speech: Speech normal.     ?   Behavior: Behavior normal. Behavior is cooperative.     ?   Thought Content: Thought content normal.     ?   Cognition and Memory: Cognition and memory normal.     ?   Judgment: Judgment normal.  ? ? ?Assessment  ?Plan  ?Bruising left middle and ring finger - Plan: Protime-INR, APTT, Urinalysis, Routine w reflex microscopic ? ?Prediabetes - Plan: Hemoglobin A1c ?06/2021 ? ?Psoriasis - on skyrizi x 1 year Dr. Evorn Gong ? ?Polyarthralgia/numbness/tingling r/o autoimmune with left hand finger issue see HPI- Plan: Antinuclear Antib (ANA), Sedimentation rate, C-reactive protein, Rheumatoid Factor, Cyclic citrul peptide antibody, IgG ([facility]), CBC with Differential/Platelet, Comprehensive metabolic panel, TSH ? ?B12 deficiency - Plan: Vitamin B12 ? ?Numbness and tingling - Plan: CBC with Differential/Platelet, Comprehensive metabolic panel, TSH, Vitamin B12  ?Consider emg/ncs in the future  ? ?HM ?Flu shot declines  ?Tdap 05/05/17  ?Hpv x 3 utd  ?3 total covid shots pfizer  ? ?Pap 06/25/20 LSIL + HPV Dr. Leafy Ro, 06/2019 ASCUS cant exclude high grade squamous IEL  ? ?Mammo age 69  ? ?Colonoscopy age 21  ? ?Rec healthy diet and exercise  ? ?[facility]  ?[facility]  ?Former PCP [facility] [place]  ?Podiatry Dr. Milinda Pointer [facility] ?Derm Dr. Evorn Gong  ? ? ?Provider: Dr. Olivia Mackie McLean[facility]  ?

## 2021-03-18 NOTE — Patient Instructions (Signed)
Paresthesia °Paresthesia is an abnormal burning or prickling sensation. It is usually felt in the hands, arms, legs, or feet. However, it may occur in any part of the body. Usually, paresthesia is not painful. It may feel like: °Tingling or numbness. °Buzzing. °Itching. °Paresthesia may occur without any clear cause, or it may be caused by: °Breathing too quickly (hyperventilation). °Pressure on a nerve. °An underlying medical condition. °Side effects of a medication. °Nutritional deficiencies. °Exposure to toxic chemicals. °Most people experience temporary (transient) paresthesia at some time in their lives. For some people, it may be long-lasting (chronic) because of an underlying medical condition. If you have paresthesia that lasts a long time, you need to be evaluated by your health care provider. °Follow these instructions at home: °Alcohol use ° °Do not drink alcohol if: °Your health care provider tells you not to drink. °You are pregnant, may be pregnant, or are planning to become pregnant. °If you drink alcohol: °Limit how much you use to: °0-1 drink a day for women. °0-2 drinks a day for men. °Be aware of how much alcohol is in your drink. In the [place] one drink equals one 12 oz bottle of beer (355 mL), one 5 oz glass of wine (148 mL), or one 1½ oz glass of hard liquor (44 mL). °Nutrition ° °Eat a healthy diet. This includes: °Eating foods that are high in fiber, such as fresh fruits and vegetables, whole grains, and beans. °Limiting foods that are high in fat and processed sugars, such as fried or sweet foods. °General instructions °Take over-the-counter and prescription medicines only as told by your health care provider. °Do not use any products that contain nicotine or tobacco, such as cigarettes and e-cigarettes. These can keep blood from reaching damaged nerves. If you need help quitting, ask your health care provider. °If you have diabetes, work closely with your health care provider to keep your  blood sugar under control. °If you have numbness in your feet: °Check every day for signs of injury or infection. Watch for redness, warmth, and swelling. °Wear padded socks and comfortable shoes. These help protect your feet. °Keep all follow-up visits as told by your health care provider. This is important. °Contact a health care provider if you: °Have paresthesia that gets worse or does not go away. °Have numbness after an injury. °Have a burning or prickling feeling that gets worse when you walk. °Have pain, cramps, or dizziness, or you faint. °Develop a rash. °Get help right away if you: °Feel muscle weakness. °Develop new weakness in an arm or leg. °Have trouble walking or moving. °Have problems with speech, understanding, or vision. °Feel confused. °Cannot control your bladder or bowel movements. °Summary °Paresthesia is an abnormal burning or prickling sensation that is usually felt in the hands, arms, legs, or feet. It may also occur in other parts of the body. °Paresthesia may occur without any clear cause, or it may be caused by breathing too quickly (hyperventilation), pressure on a nerve, an underlying medical condition, side effects of a medication, nutritional deficiencies, or exposure to toxic chemicals. °If you have paresthesia that lasts a long time, you need to be evaluated by your health care provider. °This information is not intended to replace advice given to you by your health care provider. Make sure you discuss any questions you have with your health care provider. °Document Revised: 10/09/2019 Document Reviewed: 10/09/2019 °Elsevier Patient Education © 2022 [employer] ° °

## 2021-03-18 NOTE — Addendum Note (Signed)
Addended by: Warden Fillers on: 03/18/2021 03:36 PM ? ? Modules accepted: Orders ? ?

## 2021-03-19 ENCOUNTER — Encounter: Payer: Self-pay | Admitting: Internal Medicine

## 2021-03-19 DIAGNOSIS — E559 Vitamin D deficiency, unspecified: Secondary | ICD-10-CM | POA: Insufficient documentation

## 2021-03-19 LAB — CBC WITH DIFFERENTIAL/PLATELET
Basophils Absolute: 0 10*3/uL (ref 0.0–0.1)
Basophils Relative: 0.2 % (ref 0.0–3.0)
Eosinophils Absolute: 0.4 10*3/uL (ref 0.0–0.7)
Eosinophils Relative: 4.2 % (ref 0.0–5.0)
HCT: 37.7 % (ref 36.0–46.0)
Hemoglobin: 12.4 g/dL (ref 12.0–15.0)
Lymphocytes Relative: 30.7 % (ref 12.0–46.0)
Lymphs Abs: 2.7 10*3/uL (ref 0.7–4.0)
MCHC: 32.9 g/dL (ref 30.0–36.0)
MCV: 84.7 fl (ref 78.0–100.0)
Monocytes Absolute: 0.6 10*3/uL (ref 0.1–1.0)
Monocytes Relative: 6.9 % (ref 3.0–12.0)
Neutro Abs: 5.1 10*3/uL (ref 1.4–7.7)
Neutrophils Relative %: 58 % (ref 43.0–77.0)
Platelets: 359 10*3/uL (ref 150.0–400.0)
RBC: 4.46 Mil/uL (ref 3.87–5.11)
RDW: 13.8 % (ref 11.5–15.5)
WBC: 8.8 10*3/uL (ref 4.0–10.5)

## 2021-03-19 LAB — VITAMIN D 25 HYDROXY (VIT D DEFICIENCY, FRACTURES): VITD: 26.01 ng/mL — ABNORMAL LOW (ref 30.00–100.00)

## 2021-03-19 LAB — COMPREHENSIVE METABOLIC PANEL
ALT: 8 U/L (ref 0–35)
AST: 15 U/L (ref 0–37)
Albumin: 4.8 g/dL (ref 3.5–5.2)
Alkaline Phosphatase: 50 U/L (ref 39–117)
BUN: 13 mg/dL (ref 6–23)
CO2: 27 mEq/L (ref 19–32)
Calcium: 9.8 mg/dL (ref 8.4–10.5)
Chloride: 102 mEq/L (ref 96–112)
Creatinine, Ser: 0.81 mg/dL (ref 0.40–1.20)
GFR: 96.85 mL/min (ref >60.00)
Glucose, Bld: 81 mg/dL (ref 70–99)
Potassium: 4.7 mEq/L (ref 3.5–5.1)
Sodium: 137 mEq/L (ref 135–145)
Total Bilirubin: 0.5 mg/dL (ref 0.2–1.2)
Total Protein: 7.5 g/dL (ref 6.0–8.3)

## 2021-03-19 LAB — URINALYSIS, ROUTINE W REFLEX MICROSCOPIC
Bilirubin Urine: NEGATIVE
Glucose, UA: NEGATIVE
Hgb urine dipstick: NEGATIVE
Ketones, ur: NEGATIVE
Leukocytes,Ua: NEGATIVE
Nitrite: NEGATIVE
Protein, ur: NEGATIVE
Specific Gravity, Urine: 1.011 (ref 1.001–1.035)
pH: 7.5 (ref 5.0–8.0)

## 2021-03-19 LAB — C-REACTIVE PROTEIN: CRP: <1 mg/dL (ref 0.5–20.0)

## 2021-03-19 LAB — TSH: TSH: 1.57 u[IU]/mL (ref 0.35–5.50)

## 2021-03-19 LAB — SEDIMENTATION RATE: Sed Rate: 23 mm/hr — ABNORMAL HIGH (ref 0–20)

## 2021-03-19 LAB — VITAMIN B12: Vitamin B-12: 401 pg/mL (ref 211–911)

## 2021-03-20 LAB — HEPATITIS B SURFACE ANTIBODY, QUANTITATIVE: Hep B S AB Quant (Post): <5 m[IU]/mL — ABNORMAL LOW (ref >=10)

## 2021-03-20 LAB — CYCLIC CITRUL PEPTIDE ANTIBODY, IGG: Cyclic Citrullin Peptide Ab: <16 UNITS

## 2021-03-20 LAB — ANA: Anti Nuclear Antibody (ANA): NEGATIVE

## 2021-03-20 LAB — RHEUMATOID FACTOR: Rheumatoid fact SerPl-aCnc: <14 IU/mL (ref <14)

## 2021-04-23 ENCOUNTER — Encounter: Payer: Self-pay | Admitting: Podiatry

## 2021-04-23 ENCOUNTER — Ambulatory Visit: Payer: BC Managed Care – PPO | Admitting: Podiatry

## 2021-04-23 DIAGNOSIS — M66872 Spontaneous rupture of other tendons, left ankle and foot: Secondary | ICD-10-CM | POA: Diagnosis not present

## 2021-04-23 MED ORDER — METHYLPREDNISOLONE 4 MG PO TBPK: ORAL_TABLET | ORAL | 0 refills | Status: DC

## 2021-04-26 NOTE — Progress Notes (Signed)
She presents today for follow-up of her posterior tibial tendinitis last seen on October 16, 2019.  She states for the most part has been tolerable has been Pat tolerable wearing the brace and the shoes and the orthotics but lately he just cannot seem to be getting any better at all. ? ?Objective: Vital signs are stable she is alert and oriented x3.  Pulses are palpable.  She has moderate edema along the medial aspect of the left ankle and severe tenderness and fluctuance on palpation of the posterior tibial tendon as it courses beneath the medial malleolus extending to the navicular tuberosity.  Exquisitely tender in this area.  She has pain on inversion against resistance. ? ?Assessment: Probable tear posterior tibial tendon conservative therapies have been exhausted. ? ?Plan: Discussed etiology pathology and surgical therapies at this point I highly recommended an MRI for evaluation the posterior tibial tendons integrity posterior tibial tendon differential diagnoses as well as surgical planning.  I will follow-up with her once that comes in. ?

## 2021-05-04 ENCOUNTER — Ambulatory Visit
Admission: RE | Admit: 2021-05-04 | Discharge: 2021-05-04 | Disposition: A | Discharge status: Home / Self Care | Payer: BC Managed Care – PPO | Source: Ambulatory Visit | Attending: Podiatry | Admitting: Podiatry

## 2021-05-04 DIAGNOSIS — M66872 Spontaneous rupture of other tendons, left ankle and foot: Secondary | ICD-10-CM

## 2021-05-08 ENCOUNTER — Telehealth: Payer: Self-pay | Admitting: *Deleted

## 2021-05-08 DIAGNOSIS — M76822 Posterior tibial tendinitis, left leg: Secondary | ICD-10-CM

## 2021-05-08 NOTE — Telephone Encounter (Signed)
-----   Message from Garrel Ridgel, [place] sent at 05/06/2021  7:12 AM EDT ----- ?There is no tear of the tendon.  Pt is going to be the best thing for her.  Could you set that up please. ?

## 2021-06-29 NOTE — Therapy (Signed)
OUTPATIENT PHYSICAL THERAPY LOWER EXTREMITY EVALUATION   Patient Name: Monica Dunlap MRN: [id number] DOB:[birth year], 31 y.o., female Today's Date: 06/30/2021   PT End of Session - 06/30/21 1515     Visit Number 1    Number of Visits 8    Date for PT Re-Evaluation 08/25/21    Authorization Type [employer]    PT Start Time 1500    PT Stop Time 1544    PT Time Calculation (min) 44 min    Activity Tolerance Patient tolerated treatment well    Behavior During Therapy WFL for tasks assessed/performed             Past Medical History:  Diagnosis Date   Acne    Back pain    Prediabetes    Psoriasis    PTTD (posterior tibial tendon dysfunction)    Past Surgical History:  Procedure Laterality Date   NO PAST SURGERIES     Patient Active Problem List   Diagnosis Date Noted   Vitamin D deficiency 03/19/2021   Psoriasis 03/18/2021   Prediabetes 03/18/2021   Numbness and tingling 03/18/2021   Left ankle tendonitis 03/18/2021    PCP: McLean-Scocuzza, Pasty Spillers, MD  REFERRING PROVIDER: Elinor Parkinson, DPM  REFERRING DIAG: [phone] (ICD-10-CM) - Posterior tibial tendinitis of left lower extremity  THERAPY DIAG:  Pain in left ankle and joints of left foot - Plan: PT plan of care cert/re-cert  Muscle weakness (generalized) - Plan: PT plan of care cert/re-cert  Difficulty in walking, not elsewhere classified - Plan: PT plan of care cert/re-cert  Rationale for Evaluation and Treatment Rehabilitation  ONSET DATE: In college around 2012-214  SUBJECTIVE:   SUBJECTIVE STATEMENT: I have a constant swelling in my medial side of ankle. I even wear tennis shoes and it doesn't seem to make a difference.   PERTINENT HISTORY: Back pain, prediabetes, vitamin D deficiency, Psoriasis, PTTD/ left ankle tendonitis  PAIN:  Are you having pain? Yes: NPRS scale: 0/10  at eval because not on feet teaching but at worst  5/10 but during a school work day 3/10 Pain location: Posterior tibialis  insertiion and along muscle between navicular and malleolus Pain description: achy sharp after a heavy work out Aggravating factors: sitting for too long 1 hour or standing for 1 hour,  Relieving factors: sitting and pressure off it Compression sleeve PRECAUTIONS: None  WEIGHT BEARING RESTRICTIONS No  FALLS:  Has patient fallen in last 6 months? No  LIVING ENVIRONMENT: Lives with: lives alone Lives in: House/apartment Stairs: Yes: Internal: 15 steps; on left going up Has following equipment at home: None  OCCUPATION: teacher Kindergarten  PLOF: Independent  PATIENT GOALS I would love to be able to wear shoes other than tennis shoes due to the swelling.  I am still able to exercise but sometimes it hurts when I do too much. When I do a lot of things on my toes I notice that I have more pain in arch and through the arch part.  I am more aware of my pain when I am travelling or sitting or dangling my legs more   OBJECTIVE:   DIAGNOSTIC FINDINGS: IMPRESSION: 05-05-21 Subcutaneous edema along the medial ankle. No focal fluid collection.   Minimal tenosynovitis of the posterior tibial tendon at the level of the talar head/neck and as a passes beneath the medial malleolus. No acute tendon tear.   Intact ankle ligaments.  PATIENT SURVEYS:  FOTO 69% predicted 80  COGNITION:  Overall cognitive status: Within  functional limits for tasks assessed     SENSATION: WFL  EDEMA:  Figure 8: left 52.5 cm and Right is 49.75 cm    POSTURE: rounded shoulders, forward head, and anterior pelvic tilt, slender ectomorphic body type Pt with flattened left arch compared to Right but not collapsed.  PALPATION: TTP over posterior tib from navicular to left lateral malleolus  LOWER EXTREMITY ROM:   WFL  except note  Active ROM Right eval Left eval  Hip flexion    Hip extension    Hip abduction    Hip adduction    Hip internal rotation    Hip external rotation    Knee flexion     Knee extension    Ankle dorsiflexion 11 10  Ankle plantarflexion 61 60  Ankle inversion 41 41  Ankle eversion 30 26   (Blank rows = not tested)  LOWER EXTREMITY MMT:  MMT Right eval Left eval  Hip flexion    Hip extension    Hip abduction    Hip adduction    Hip internal rotation    Hip external rotation    Knee flexion    Knee extension    Ankle dorsiflexion    Ankle plantarflexion SLS 25/25  SLS 10/25   Ankle inversion    Ankle eversion     (Blank rows = not tested)   FUNCTIONAL TESTS:  5 times sit to stand: 6.37 sec   Squat- pt able to squat to about 80 degrees hip flexion but wt bears to Right to unweight left  foot to prevent collapse of medial arch  GAIT: Distance walked: 200 feet  Assistive device utilized: None Level of assistance: Complete Independence Comments: Pt with decreased    TODAY'S TREATMENT: Access Code: [email or web address] URL: [email or web address] Date: 06/30/2021 Prepared by: Garen Lah  Program Notes SOLE to SOLE exercise  Begin with 100 repetitions taking short rest as needed. Over two weeks work up to 300 repetitions continuously  Exercises - Heel Raises with Counter Support  - 1 x daily - 7 x weekly - 5 sets - 10 reps -   hold - Ankle Inversion with Resistance  - 1 x daily - 7 x weekly - 3 sets - 10 reps - Seated Ankle Eversion with Resistance  - 1 x daily - 7 x weekly - 3 sets - 10 reps - Gastroc Stretch on Wall  - 1 x daily - 7 x weekly - 3 reps - 30 sec hold - Soleus Stretch on Wall  - 1 x daily - 7 x weekly - 1 sets - 3 reps - 30 sec hold   PATIENT EDUCATION:  Education details: POC Explanation of findings, FOTO explanation issue HEP Person educated: Patient Education method: Explanation, Demonstration, Tactile cues, Verbal cues, and Handouts Education comprehension: verbalized understanding, returned demonstration, verbal cues required, tactile cues required, and needs further education   HOME EXERCISE  PROGRAM: Access Code: [email or web address] URL: [email or web address] Date: 06/30/2021 Prepared by: Garen Lah  Program Notes SOLE to SOLE exercise  Begin with 100 repetitions taking short rest as needed. 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  She has a history of posterior tendon tendonitis since college in 2012 to 2014.  Pt has partial medial collapse but does not bear weight through navicular of left ankle. Pt does have decreased wt bear through left Great toe first Ray in ambulation.  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   ACTIVITY LIMITATIONS sitting, standing, and stairs  PARTICIPATION LIMITATIONS: driving, occupation, and traveling and sitting for more than 1 hours  PERSONAL FACTORS  long history of PTTD since 2012/2014 without resolving pain  are also affecting patient's functional outcome.   REHAB POTENTIAL: Good  CLINICAL DECISION MAKING: Stable/uncomplicated  EVALUATION COMPLEXITY: Low   GOALS: Goals reviewed with patient? Yes  SHORT TERM GOALS:  Target date: 07/28/2021  Pt will be independent with initial HEP Baseline:no knowledge Goal status: INITIAL  2.  Pt when sitting for  1 hour will not have pain greater than 3/10 Baseline:  5/10 after sitting for 1 hour Goal status: INITIAL  3.  Pt will be more aware of her gait and bearing weight through her 1st ray/great toe ball of foot Baseline:  Pt tends to collapse medial arch when walking Goal status: INITIAL    LONG TERM GOALS: Target date: 08/25/2021   Pt will be independent with advanced HEP including lifting heavier weight  with out increasing pain in medial left arch Baseline:  Pt feels pain in medial arch when lifting more than 25 # Goal status: INITIAL  2.  FOTO will improve from  69%  to  80%   indicating improved functional mobility .  Baseline: Eval 69% Goal status: INITIAL  3.  Pt will be able to tolerate shoes other than flat tennis shoes for 1 hour Baseline: unable to tolerate any shoes besides flat tennis shoes Goal status: INITIAL  4.  Pt will be able to sit and travel for 2 hours with minimal exacerbation of pain Baseline:  5/10 after 1 hour of sitting Goal status: INITIAL  5.  Pt will be able to stand for 2 hours with minimal exacerbation of pain Baseline:  5/10 after 1 hours of standing /teaching Goal status: INITIAL  6.  Pt will be able to SLS on left to 35 x without pain greater than 1/10 Baseline: 10/25  1/10 on eval Goal status: INITIAL   PLAN: PT FREQUENCY: 1x/week  PT DURATION: 8 weeks  PLANNED INTERVENTIONS: Therapeutic exercises, Therapeutic activity, Neuromuscular re-education, Balance training, Gait training, Patient/Family education, Joint mobilization, Stair training, Dry Needling, Electrical stimulation, Cryotherapy, Moist heat, Taping, Vasopneumatic device, Ionotophoresis 4mg /ml Dexamethasone, Manual therapy, and Re-evaluation  PLAN FOR NEXT SESSION: REview HEP, possible TPDN of posterior tibialis  , PT,  ATRIC Certified Exercise Expert for the Aging Adult  06/30/21 5:14  PM Phone: [phone] Fax: [phone]

## 2021-06-30 ENCOUNTER — Ambulatory Visit: Payer: BC Managed Care – PPO | Attending: Podiatry | Admitting: Physical Therapy

## 2021-06-30 DIAGNOSIS — M76822 Posterior tibial tendinitis, left leg: Secondary | ICD-10-CM | POA: Diagnosis not present

## 2021-06-30 DIAGNOSIS — M6281 Muscle weakness (generalized): Secondary | ICD-10-CM | POA: Diagnosis present

## 2021-06-30 DIAGNOSIS — R262 Difficulty in walking, not elsewhere classified: Secondary | ICD-10-CM | POA: Diagnosis present

## 2021-06-30 DIAGNOSIS — M25572 Pain in left ankle and joints of left foot: Secondary | ICD-10-CM | POA: Insufficient documentation

## 2021-07-07 ENCOUNTER — Ambulatory Visit: Payer: BC Managed Care – PPO | Admitting: Physical Therapy

## 2021-07-07 ENCOUNTER — Encounter: Payer: Self-pay | Admitting: Physical Therapy

## 2021-07-07 ENCOUNTER — Other Ambulatory Visit (INDEPENDENT_AMBULATORY_CARE_PROVIDER_SITE_OTHER): Payer: BC Managed Care – PPO

## 2021-07-07 DIAGNOSIS — Z13220 Encounter for screening for lipoid disorders: Secondary | ICD-10-CM | POA: Diagnosis not present

## 2021-07-07 DIAGNOSIS — T148XXA Other injury of unspecified body region, initial encounter: Secondary | ICD-10-CM

## 2021-07-07 DIAGNOSIS — M6281 Muscle weakness (generalized): Secondary | ICD-10-CM

## 2021-07-07 DIAGNOSIS — R262 Difficulty in walking, not elsewhere classified: Secondary | ICD-10-CM

## 2021-07-07 DIAGNOSIS — R7303 Prediabetes: Secondary | ICD-10-CM | POA: Diagnosis not present

## 2021-07-07 DIAGNOSIS — M25572 Pain in left ankle and joints of left foot: Secondary | ICD-10-CM | POA: Diagnosis not present

## 2021-07-07 LAB — LIPID PANEL
Cholesterol: 157 mg/dL (ref 0–200)
HDL: 66.5 mg/dL (ref >39.00)
LDL Cholesterol: 76 mg/dL (ref 0–99)
NonHDL: 90.81
Total CHOL/HDL Ratio: 2
Triglycerides: 72 mg/dL (ref 0.0–149.0)
VLDL: 14.4 mg/dL (ref 0.0–40.0)

## 2021-07-07 LAB — APTT: aPTT: 33.5 s (ref 25.4–36.8)

## 2021-07-07 LAB — PROTIME-INR
INR: 1.1 ratio — ABNORMAL HIGH (ref 0.8–1.0)
Prothrombin Time: 11.7 s (ref 9.6–13.1)

## 2021-07-07 LAB — HEMOGLOBIN A1C: Hgb A1c MFr Bld: 5.8 % (ref 4.6–6.5)

## 2021-07-09 ENCOUNTER — Encounter: Payer: Self-pay | Admitting: Internal Medicine

## 2021-07-09 ENCOUNTER — Ambulatory Visit (INDEPENDENT_AMBULATORY_CARE_PROVIDER_SITE_OTHER): Payer: BC Managed Care – PPO | Admitting: Internal Medicine

## 2021-07-09 VITALS — BP 124/84 | HR 84 | Temp 98.3°F | Resp 14 | Ht 64.57 in | Wt 142.8 lb

## 2021-07-09 DIAGNOSIS — Z0000 Encounter for general adult medical examination without abnormal findings: Secondary | ICD-10-CM

## 2021-07-09 DIAGNOSIS — R7303 Prediabetes: Secondary | ICD-10-CM | POA: Diagnosis not present

## 2021-07-09 NOTE — Progress Notes (Signed)
Chief Complaint  Patient presents with   Follow-up    Denies any concerns or pain.   Annual Exam   annual 1. +prediabetes 5.8 FH mom she will cut down on pasta 2. Bruising stopped but inr sl high 1.1 disc consider hematology in the future but for now hold not bothersome    Review of Systems  Constitutional:  Negative for weight loss.  HENT:  Negative for hearing loss.   Eyes:  Negative for blurred vision.  Respiratory:  Negative for shortness of breath.   Cardiovascular:  Negative for chest pain.  Gastrointestinal:  Negative for abdominal pain and blood in stool.  Genitourinary:  Negative for dysuria.  Musculoskeletal:  Negative for falls and joint pain.  Skin:  Negative for rash.  Neurological:  Negative for headaches.  Psychiatric/Behavioral:  Negative for depression.    Past Medical History:  Diagnosis Date   Acne    Back pain    Prediabetes    Psoriasis    PTTD (posterior tibial tendon dysfunction)    Past Surgical History:  Procedure Laterality Date   NO PAST SURGERIES     Family History  Problem Relation Age of Onset   Hyperlipidemia Mother    Hypertension Mother    Healthy Mother    Other Mother        prediabetes   Hyperlipidemia Father    Hypertension Father    Healthy Father    Hypertension Maternal Grandmother    Cancer Maternal Grandmother        not a smoker   Arthritis Maternal Grandmother    Diabetes Maternal Grandfather    Hypertension Maternal Grandfather    Hypertension Paternal Grandmother    Cancer Paternal Grandmother        multiple myeloma   Hypertension Paternal Grandfather    Social History   Socioeconomic History   Marital status: Single    Spouse name: Not on file   Number of children: Not on file   Years of education: Not on file   Highest education level: Not on file  Occupational History   Not on file  Tobacco Use   Smoking status: Never   Smokeless tobacco: Never  Vaping Use   Vaping Use: Never used  Substance and  Sexual Activity   Alcohol use: Yes   Drug use: Never   Sexual activity: Yes    Partners: Male  Other Topics Concern   Not on file  Social History Narrative   Kindergarden Therapist, occupational and yoga likes   Social Determinants of Health   Financial Resource Strain: Not on file  Food Insecurity: Not on file  Transportation Needs: Not on file  Physical Activity: Not on file  Stress: Not on file  Social Connections: Not on file  Intimate Partner Violence: Not on file   Current Meds  Medication Sig   levonorgestrel (LILETTA, 52 MG,) 19.5 MCG/DAY IUD IUD by Intrauterine route.   SKYRIZI PEN 150 MG/ML SOAJ Inject into the skin.   No Known Allergies Recent Results (from the past 2160 hour(s))  Protime-INR     Status: Abnormal   Collection Time: 07/07/21  7:50 AM  Result Value Ref Range   INR 1.1 (H) 0.8 - 1.0 ratio   Prothrombin Time 11.7 9.6 - 13.1 sec  APTT     Status: None   Collection Time: 07/07/21  7:50 AM  Result Value Ref Range   aPTT 33.5 25.4 - 36.8 SEC  Hemoglobin A1c  Status: None   Collection Time: 07/07/21  7:50 AM  Result Value Ref Range   Hgb A1c MFr Bld 5.8 4.6 - 6.5 %    Comment: Glycemic Control Guidelines for People with Diabetes:Non Diabetic:  <6%Goal of Therapy: <7%Additional Action Suggested:  >8%   Lipid panel     Status: None   Collection Time: 07/07/21  7:50 AM  Result Value Ref Range   Cholesterol 157 0 - 200 mg/dL    Comment: ATP III Classification       Desirable:  < 200 mg/dL               Borderline High:  200 - 239 mg/dL          High:  > = 240 mg/dL   Triglycerides 72.0 0.0 - 149.0 mg/dL    Comment: Normal:  <150 mg/dLBorderline High:  150 - 199 mg/dL   HDL 66.50 >39.00 mg/dL   VLDL 14.4 0.0 - 40.0 mg/dL   LDL Cholesterol 76 0 - 99 mg/dL   Total CHOL/HDL Ratio 2     Comment:                Men          Women1/2 Average Risk     3.4          3.3Average Risk          5.0          4.42X Average Risk          9.6          7.13X Average Risk           15.0          11.0                       NonHDL 90.81     Comment: NOTE:  Non-HDL goal should be 30 mg/dL higher than patient's LDL goal (i.e. LDL goal of < 70 mg/dL, would have non-HDL goal of < 100 mg/dL)   Objective  Body mass index is 24.08 kg/m. Wt Readings from Last 3 Encounters:  07/09/21 142 lb 12.8 oz (64.8 kg)  03/18/21 137 lb 12.8 oz (62.5 kg)  08/09/19 130 lb (59 kg)   Temp Readings from Last 3 Encounters:  07/09/21 98.3 F (36.8 C) (Oral)  03/18/21 98.4 F (36.9 C) (Oral)  02/07/21 98.9 F (37.2 C) (Oral)   BP Readings from Last 3 Encounters:  07/09/21 124/84  03/18/21 132/80  02/07/21 (!) 133/92   Pulse Readings from Last 3 Encounters:  07/09/21 84  03/18/21 85  02/07/21 83    Physical Exam Vitals and nursing note reviewed.  Constitutional:      Appearance: Normal appearance. She is well-developed and well-groomed.  HENT:     Head: Normocephalic and atraumatic.  Eyes:     Conjunctiva/sclera: Conjunctivae normal.     Pupils: Pupils are equal, round, and reactive to light.  Cardiovascular:     Rate and Rhythm: Normal rate and regular rhythm.     Heart sounds: Normal heart sounds. No murmur heard. Pulmonary:     Effort: Pulmonary effort is normal.     Breath sounds: Normal breath sounds.  Abdominal:     General: Abdomen is flat. Bowel sounds are normal.     Tenderness: There is no abdominal tenderness.  Musculoskeletal:        General: No tenderness.  Skin:    General:  Skin is warm and dry.  Neurological:     General: No focal deficit present.     Mental Status: She is alert and oriented to person, place, and time. Mental status is at baseline.     Cranial Nerves: Cranial nerves 2-12 are intact.     Sensory: Sensation is intact.     Motor: Motor function is intact.     Coordination: Coordination is intact.     Gait: Gait is intact.  Psychiatric:        Attention and Perception: Attention and perception normal.        Mood and Affect:  Mood and affect normal.        Speech: Speech normal.        Behavior: Behavior normal. Behavior is cooperative.        Thought Content: Thought content normal.        Cognition and Memory: Cognition and memory normal.        Judgment: Judgment normal.     Assessment  Plan  Annual physical exam  Prediabetes  Lifestyle changes and exercise   HM Flu shot declines  Tdap 05/05/17  Hpv x 3 utd  3 total covid shots pfizer    Pap 06/25/20 LSIL + HPV Dr. Leafy Ro, 06/2019 ASCUS cant exclude high grade squamous IEL  H/o colp   Mammo age 58    Colonoscopy age 42    Rec healthy diet and exercise    [facility]  [facility]  Former PCP [facility] [place]  Podiatry Dr. Milinda Pointer [facility] D[facility] Dr. Evorn Gong     Provider: Dr. Olivia Mackie McLean-Scocuzza-Internal Medicine

## 2021-07-09 NOTE — Patient Instructions (Addendum)
Debrox ear wax cleaning drops   Prediabetes Prediabetes is when your blood sugar (blood glucose) level is higher than normal but not high enough for you to be diagnosed with type 2 diabetes. Having prediabetes puts you at risk for developing type 2 diabetes (type 2 diabetes mellitus). With certain lifestyle changes, you may be able to prevent or delay the onset of type 2 diabetes. This is important because type 2 diabetes can lead to serious complications, such as: Heart disease. Stroke. Blindness. Kidney disease. Depression. Poor circulation in the feet and legs. In severe cases, this could lead to surgical removal of a leg (amputation). What are the causes? The exact cause of prediabetes is not known. It may result from insulin resistance. Insulin resistance develops when cells in the body do not respond properly to insulin that the body makes. This can cause excess glucose to build up in the blood. High blood glucose (hyperglycemia) can develop. What increases the risk? The following factors may make you more likely to develop this condition: You have a family member with type 2 diabetes. You are older than 45 years. You had a temporary form of diabetes during a pregnancy (gestational diabetes). You had polycystic ovary syndrome (PCOS). You are overweight or obese. You are inactive (sedentary). You have a history of heart disease, including problems with cholesterol levels, high levels of blood fats, or high blood pressure. What are the signs or symptoms? You may have no symptoms. If you do have symptoms, they may include: Increased hunger. Increased thirst. Increased urination. Vision changes, such as blurry vision. Tiredness (fatigue). How is this diagnosed? This condition can be diagnosed with blood tests. Your blood glucose may be checked with one or more of the following tests: A fasting blood glucose (FBG) test. You will not be allowed to eat (you will fast) for at least 8  hours before a blood sample is taken. An A1C blood test (hemoglobin A1C). This test provides information about blood glucose levels over the previous 2?3 months. An oral glucose tolerance test (OGTT). This test measures your blood glucose at two points in time: After fasting. This is your baseline level. Two hours after you drink a beverage that contains glucose. You may be diagnosed with prediabetes if: Your FBG is 100?125 mg/dL (4.4-3.1 mmol/L). Your A1C level is 5.7?6.4% (39-46 mmol/mol). Your OGTT result is 140?199 mg/dL (5.4-00 mmol/L). These blood tests may be repeated to confirm your diagnosis. How is this treated? Treatment may include dietary and lifestyle changes to help lower your blood glucose and prevent type 2 diabetes from developing. In some cases, medicine may be prescribed to help lower the risk of type 2 diabetes. Follow these instructions at home: Nutrition  Follow a healthy meal plan. This includes eating lean proteins, whole grains, legumes, fresh fruits and vegetables, low-fat dairy products, and healthy fats. Follow instructions from your health care provider about eating or drinking restrictions. Meet with a dietitian to create a healthy eating plan that is right for you. Lifestyle Do moderate-intensity exercise for at least 30 minutes a day on 5 or more days each week, or as told by your health care provider. A mix of activities may be best, such as: Brisk walking, swimming, biking, and weight lifting. Lose weight as told by your health care provider. Losing 5-7% of your body weight can reverse insulin resistance. Do not drink alcohol if: Your health care provider tells you not to drink. You are pregnant, may be pregnant, or are planning  to become pregnant. If you drink alcohol: Limit how much you use to: 0-1 drink a day for women. 0-2 drinks a day for men. Be aware of how much alcohol is in your drink. In the [place] one drink equals one 12 oz bottle of beer (355  mL), one 5 oz glass of wine (148 mL), or one 1 oz glass of hard liquor (44 mL). General instructions Take over-the-counter and prescription medicines only as told by your health care provider. You may be prescribed medicines that help lower the risk of type 2 diabetes. Do not use any products that contain nicotine or tobacco, such as cigarettes, e-cigarettes, and chewing tobacco. If you need help quitting, ask your health care provider. Keep all follow-up visits. This is important. Where to find more information American Diabetes Association: www.diabetes.org Academy of Nutrition and Dietetics: www.eatright.org American Heart Association: www.heart.org Contact a health care provider if: You have any of these symptoms: Increased hunger. Increased urination. Increased thirst. Fatigue. Vision changes, such as blurry vision. Get help right away if you: Have shortness of breath. Feel confused. Vomit or feel like you may vomit. Summary Prediabetes is when your blood sugar (blood glucose)level is higher than normal but not high enough for you to be diagnosed with type 2 diabetes. Having prediabetes puts you at risk for developing type 2 diabetes (type 2 diabetes mellitus). Make lifestyle changes such as eating a healthy diet and exercising regularly to help prevent diabetes. Lose weight as told by your health care provider. This information is not intended to replace advice given to you by your health care provider. Make sure you discuss any questions you have with your health care provider. Document Revised: 03/29/2019 Document Reviewed: 03/29/2019 Elsevier Patient Education  2023 Elsevier Inc.  Prediabetes Eating Plan Prediabetes is a condition that causes blood sugar (glucose) levels to be higher than normal. This increases the risk for developing type 2 diabetes (type 2 diabetes mellitus). Working with a health care provider or nutrition specialist (dietitian) to make diet and lifestyle  changes can help prevent the onset of diabetes. These changes may help you: Control your blood glucose levels. Improve your cholesterol levels. Manage your blood pressure. What are tips for following this plan? Reading food labels Read food labels to check the amount of fat, salt (sodium), and sugar in prepackaged foods. Avoid foods that have: Saturated fats. Trans fats. Added sugars. Avoid foods that have more than 300 milligrams (mg) of sodium per serving. Limit your sodium intake to less than 2,300 mg each day. Shopping Avoid buying pre-made and processed foods. Avoid buying drinks with added sugar. Cooking Cook with olive oil. Do not use butter, lard, or ghee. Bake, broil, grill, steam, or boil foods. Avoid frying. Meal planning  Work with your dietitian to create an eating plan that is right for you. This may include tracking how many calories you take in each day. Use a food diary, notebook, or mobile application to track what you eat at each meal. Consider following a Mediterranean diet. This includes: Eating several servings of fresh fruits and vegetables each day. Eating fish at least twice a week. Eating one serving each day of whole grains, beans, nuts, and seeds. Using olive oil instead of other fats. Limiting alcohol. Limiting red meat. Using nonfat or low-fat dairy products. Consider following a plant-based diet. This includes dietary choices that focus on eating mostly vegetables and fruit, grains, beans, nuts, and seeds. If you have high blood pressure, you may need  to limit your sodium intake or follow a diet such as the DASH (Dietary Approaches to Stop Hypertension) eating plan. The DASH diet aims to lower high blood pressure. Lifestyle Set weight loss goals with help from your health care team. It is recommended that most people with prediabetes lose 7% of their body weight. Exercise for at least 30 minutes 5 or more days a week. Attend a support group or seek  support from a mental health counselor. Take over-the-counter and prescription medicines only as told by your health care provider. What foods are recommended? Fruits Berries. Bananas. Apples. Oranges. Grapes. Papaya. Mango. Pomegranate. Kiwi. Grapefruit. Cherries. Vegetables Lettuce. Spinach. Peas. Beets. Cauliflower. Cabbage. Broccoli. Carrots. Tomatoes. Squash. Eggplant. Herbs. Peppers. Onions. Cucumbers. Brussels sprouts. Grains Whole grains, such as whole-wheat or whole-grain breads, crackers, cereals, and pasta. Unsweetened oatmeal. Bulgur. Barley. Quinoa. Brown rice. Corn or whole-wheat flour tortillas or taco shells. Meats and other proteins Seafood. Poultry without skin. Lean cuts of pork and beef. Tofu. Eggs. Nuts. Beans. Dairy Low-fat or fat-free dairy products, such as yogurt, cottage cheese, and cheese. Beverages Water. Tea. Coffee. Sugar-free or diet soda. Seltzer water. Low-fat or nonfat milk. Milk alternatives, such as soy or almond milk. Fats and oils Olive oil. Canola oil. Sunflower oil. Grapeseed oil. Avocado. Walnuts. Sweets and desserts Sugar-free or low-fat pudding. Sugar-free or low-fat ice cream and other frozen treats. Seasonings and condiments Herbs. Sodium-free spices. Mustard. Relish. Low-salt, low-sugar ketchup. Low-salt, low-sugar barbecue sauce. Low-fat or fat-free mayonnaise. The items listed above may not be a complete list of recommended foods and beverages. Contact a dietitian for more information. What foods are not recommended? Fruits Fruits canned with syrup. Vegetables Canned vegetables. Frozen vegetables with butter or cream sauce. Grains Refined white flour and flour products, such as bread, pasta, snack foods, and cereals. Meats and other proteins Fatty cuts of meat. Poultry with skin. Breaded or fried meat. Processed meats. Dairy Full-fat yogurt, cheese, or milk. Beverages Sweetened drinks, such as iced tea and soda. Fats and oils Butter.  Lard. Ghee. Sweets and desserts Baked goods, such as cake, cupcakes, pastries, cookies, and cheesecake. Seasonings and condiments Spice mixes with added salt. Ketchup. Barbecue sauce. Mayonnaise. The items listed above may not be a complete list of foods and beverages that are not recommended. Contact a dietitian for more information. Where to find more information American Diabetes Association: www.diabetes.org Summary You may need to make diet and lifestyle changes to help prevent the onset of diabetes. These changes can help you control blood sugar, improve cholesterol levels, and manage blood pressure. Set weight loss goals with help from your health care team. It is recommended that most people with prediabetes lose 7% of their body weight. Consider following a Mediterranean diet. This includes eating plenty of fresh fruits and vegetables, whole grains, beans, nuts, seeds, fish, and low-fat dairy, and using olive oil instead of other fats. This information is not intended to replace advice given to you by your health care provider. Make sure you discuss any questions you have with your health care provider. Document Revised: 03/29/2019 Document Reviewed: 03/29/2019 Elsevier Patient Education  2023 [employer].

## 2021-07-13 NOTE — Therapy (Unsigned)
OUTPATIENT PHYSICAL THERAPY LOWER EXTREMITY EVALUATION   Patient Name: Monica Dunlap MRN: [id number] DOB:[birth year], 31 y.o., female Today's Date: 07/16/2021   PT End of Session - 07/16/21 1110     Visit Number 3    Number of Visits 8    Date for PT Re-Evaluation 08/25/21    Authorization Type [employer]    PT Start Time 1104    PT Stop Time 1145    PT Time Calculation (min) 41 min    Activity Tolerance Patient tolerated treatment well    Behavior During Therapy WFL for tasks assessed/performed               Past Medical History:  Diagnosis Date   Acne    Back pain    Prediabetes    Psoriasis    PTTD (posterior tibial tendon dysfunction)    Past Surgical History:  Procedure Laterality Date   NO PAST SURGERIES     Patient Active Problem List   Diagnosis Date Noted   Vitamin D deficiency 03/19/2021   Psoriasis 03/18/2021   Prediabetes 03/18/2021   Numbness and tingling 03/18/2021   Left ankle tendonitis 03/18/2021    PCP: McLean-Scocuzza, Nino Glow, MD  REFERRING PROVIDER: Garrel Ridgel, DPM  REFERRING DIAG: [phone] (ICD-10-CM) - Posterior tibial tendinitis of left lower extremity  THERAPY DIAG:  Pain in left ankle and joints of left foot  Muscle weakness (generalized)  Difficulty in walking, not elsewhere classified  Rationale for Evaluation and Treatment Rehabilitation  ONSET DATE: In college around 2012-214  SUBJECTIVE:   SUBJECTIVE STATEMENT: Pt reports walking more over the weekend, resulting in increased pain and swelling.   PERTINENT HISTORY: Back pain, prediabetes, vitamin D deficiency, Psoriasis, PTTD/ left ankle tendonitis  PAIN:  Are you having pain? Yes: NPRS scale: 0/10 Pain location: Posterior tibialis insertiion and along muscle between navicular and malleolus Pain description: achy sharp after a heavy work out Aggravating factors: sitting for too long 1 hour or standing for 1 hour,  Relieving factors: sitting and pressure off  it Compression sleeve  PRECAUTIONS: None  WEIGHT BEARING RESTRICTIONS No  FALLS:  Has patient fallen in last 6 months? No  LIVING ENVIRONMENT: Lives with: lives alone Lives in: House/apartment Stairs: Yes: Internal: 15 steps; on left going up Has following equipment at home: None  OCCUPATION: teacher Kindergarten  PLOF: Independent  PATIENT GOALS I would love to be able to wear shoes other than tennis shoes due to the swelling.  I am still able to exercise but sometimes it hurts when I do too much. When I do a lot of things on my toes I notice that I have more pain in arch and through the arch part.  I am more aware of my pain when I am travelling or sitting or dangling my legs more   OBJECTIVE:   DIAGNOSTIC FINDINGS: IMPRESSION: 05-05-21 Subcutaneous edema along the medial ankle. No focal fluid collection.   Minimal tenosynovitis of the posterior tibial tendon at the level of the talar head/neck and as a passes beneath the medial malleolus. No acute tendon tear.   Intact ankle ligaments.  PATIENT SURVEYS:  FOTO 69% predicted 80  COGNITION:  Overall cognitive status: Within functional limits for tasks assessed     SENSATION: WFL  EDEMA:  Figure 8: left 52.5 cm and Right is 49.75 cm  POSTURE: rounded shoulders, forward head, and anterior pelvic tilt, slender ectomorphic body type Pt with flattened left arch compared to Right but not collapsed.  PALPATION: TTP over posterior tib from navicular to left lateral malleolus  LOWER EXTREMITY ROM:   WFL  except note  Active ROM Right eval Left eval  Hip flexion    Hip extension    Hip abduction    Hip adduction    Hip internal rotation    Hip external rotation    Knee flexion    Knee extension    Ankle dorsiflexion 11 10  Ankle plantarflexion 61 60  Ankle inversion 41 41  Ankle eversion 30 26   (Blank rows = not tested)  LOWER EXTREMITY MMT:  MMT Right eval Left eval  Hip flexion    Hip extension     Hip abduction    Hip adduction    Hip internal rotation    Hip external rotation    Knee flexion    Knee extension    Ankle dorsiflexion    Ankle plantarflexion SLS 25/25  SLS 10/25   Ankle inversion    Ankle eversion     (Blank rows = not tested)   FUNCTIONAL TESTS:  5 times sit to stand: 6.37 sec   Squat- pt able to squat to about 80 degrees hip flexion but wt bears to Right to unweight left  foot to prevent collapse of medial arch  GAIT: Distance walked: 200 feet  Assistive device utilized: None Level of assistance: Complete Independence Comments: Pt with decreased  TODAY'S TREATMENT: [facility] Adult PT Treatment:                                                DATE: 07/16/2021 Therapeutic Exercise: NuStep 5 min lvl 4 while taking subjective.  Bilat hip/ Bilat sidelying ER 2 x 15 Calf stretch 2 x 30 sec knee bent/ straight on slant board 4 way ankle with RTB 1x15.  Heel raise with met heads on rolled towel 2 x 15,  Heel raise with ball between ankles to promote posterior tib 1 x 20 Hip Abduction 2 x 10 Bilat  Hip Extension 2 x 10 Bilat Toe Yoga x 15 Bilat   Trigger Point Dry-Needling  Treatment instructions: Expect mild to moderate muscle soreness. S/S of pneumothorax if dry needled over a lung field, and to seek immediate medical attention should they occur. Patient verbalized understanding of these instructions and education.  Patient Consent Given: Yes Education handout provided: Yes Muscles treated: posterior tib and gastroc on the L Electrical stimulation performed: No Parameters: N/A Treatment response/outcome: twitch response and muscle lengthening.   [facility] Adult PT Treatment:                                                DATE: 07/07/2021 Therapeutic Exercise: L hip/ L sidelying ER 2 x 15 Calf stretch 2 x 30 sec knee bent/ straight on slant board Heel raise with met heads on rolled towel 2 x 15,  Heel raise with ball between ankles to promote posterior tib 1 x  20 Manual Therapy: MTPR along the posterior tib, and taught how to perform  Trigger Point Dry-Needling  Treatment instructions: Expect mild to moderate muscle soreness. S/S of pneumothorax if dry needled over a lung field, and to seek immediate medical attention should they occur. Patient verbalized  understanding of these instructions and education.  Patient Consent Given: Yes Education handout provided: Yes Muscles treated: posterior tib on the L Electrical stimulation performed: No Parameters: N/A Treatment response/outcome: twitch response and muscle lengthening.    Access Code: [email or web address] URL: [email or web address] Date: 06/30/2021 Prepared by: Voncille Lo  Program Notes SOLE to SOLE exercise  Begin with 100 repetitions taking short rest as needed. Over two weeks work up to 300 repetitions continuously  Mather with Counter Support  - 1 x daily - 7 x weekly - 5 sets - 10 reps -   hold - Ankle Inversion with Resistance  - 1 x daily - 7 x weekly - 3 sets - 10 reps - Seated Ankle Eversion with Resistance  - 1 x daily - 7 x weekly - 3 sets - 10 reps - Gastroc Stretch on Wall  - 1 x daily - 7 x weekly - 3 reps - 30 sec hold - Soleus Stretch on Wall  - 1 x daily - 7 x weekly - 1 sets - 3 reps - 30 sec hold   PATIENT EDUCATION:  Education details: POC Explanation of findings, FOTO explanation issue HEP Person educated: Patient Education method: Explanation, Demonstration, Tactile cues, Verbal cues, and Handouts Education comprehension: verbalized understanding, returned demonstration, verbal cues required, tactile cues required, and needs further education   HOME EXERCISE PROGRAM: Access Code: [email or web address] URL: [email or web address] Date: 06/30/2021 Prepared by: Voncille Lo  Program Notes SOLE to SOLE exercise  Begin with 100 repetitions taking short rest as needed. Over two weeks work up to 300 repetitions  continuously  Exercises - Heel Raises with Counter Support  - 1 x daily - 7 x weekly - 5 sets - 10 reps -   hold - Ankle Inversion with Resistance  - 1 x daily - 7 x weekly - 3 sets - 10 reps - Seated Ankle Eversion with Resistance  - 1 x daily - 7 x weekly - 3 sets - 10 reps - Gastroc Stretch on Wall  - 1 x daily - 7 x weekly - 3 reps - 30 sec hold - Soleus Stretch on Wall  - 1 x daily - 7 x weekly - 1 sets - 3 reps - 30 sec hold  ASSESSMENT:  CLINICAL IMPRESSION: Pt arrives to session reporting no pain this morning, however reports increased pain and swelling the past few days due to increased walking. Due to positive reports from [facility] last session, continued with it this session focusing on the L posterior tib and gastroc head. No muscle twitches or adverse effects noted. Continued working on posterior tib strengthening with addition of 4 way ankle with resistance. Additionally challenged pt with standing hip abduction and extension to target glutes, and toe yoga to target foot intrinsics. End of session she noted no pain but did report feeling fatigued.   OBJECTIVE IMPAIRMENTS difficulty walking, decreased ROM, decreased strength, and pain.   ACTIVITY LIMITATIONS sitting, standing, and stairs  PARTICIPATION LIMITATIONS: driving, occupation, and traveling and sitting for more than 1 hours  PERSONAL FACTORS  long history of PTTD since 2012/2014 without resolving pain  are also affecting patient's functional outcome.   REHAB POTENTIAL: Good  CLINICAL DECISION MAKING: Stable/uncomplicated  EVALUATION COMPLEXITY: Low   GOALS: Goals reviewed with patient? Yes  SHORT TERM GOALS: Target date: 08/03/2021  Pt will be independent with initial HEP Baseline:no knowledge Goal status: INITIAL  2.  Pt when sitting for 1 hour will not have pain greater  than 3/10 Baseline:  5/10 after sitting for 1 hour Goal status: INITIAL  3.  Pt will be more aware of her gait and bearing weight through  her 1st ray/great toe ball of foot Baseline:  Pt tends to collapse medial arch when walking Goal status: INITIAL    LONG TERM GOALS: Target date: 08/31/2021   Pt will be independent with advanced HEP including lifting heavier weight  with out increasing pain in medial left arch Baseline:  Pt feels pain in medial arch when lifting more than 25 # Goal status: INITIAL  2.  FOTO will improve from  69%  to  80%   indicating improved functional mobility .  Baseline: Eval 69% Goal status: INITIAL  3.  Pt will be able to tolerate shoes other than flat tennis shoes for 1 hour Baseline: unable to tolerate any shoes besides flat tennis shoes Goal status: INITIAL  4.  Pt will be able to sit and travel for 2 hours with minimal exacerbation of pain Baseline:  5/10 after 1 hour of sitting Goal status: INITIAL  5.  Pt will be able to stand for 2 hours with minimal exacerbation of pain Baseline:  5/10 after 1 hours of standing /teaching Goal status: INITIAL  6.  Pt will be able to SLS on left to 35 x without pain greater than 1/10 Baseline: 10/25  1/10 on eval Goal status: INITIAL   PLAN: PT FREQUENCY: 1x/week  PT DURATION: 8 weeks  PLANNED INTERVENTIONS: Therapeutic exercises, Therapeutic activity, Neuromuscular re-education, Balance training, Gait training, Patient/Family education, Joint mobilization, Stair training, Dry Needling, Electrical stimulation, Cryotherapy, Moist heat, Taping, Vasopneumatic device, Ionotophoresis 43m/ml Dexamethasone, Manual therapy, and Re-evaluation  PLAN FOR NEXT SESSION: REview HEP, possible TPDN of posterior tibialis. Add resistance to hip ext/ abd and towel scrunches x 15   SRudi HeapPT, DPT  07/16/21  11:56 AM

## 2021-07-16 ENCOUNTER — Encounter: Payer: Self-pay | Admitting: Physical Therapy

## 2021-07-16 ENCOUNTER — Other Ambulatory Visit: Payer: Self-pay

## 2021-07-16 ENCOUNTER — Ambulatory Visit: Payer: BC Managed Care – PPO | Attending: Podiatry | Admitting: Physical Therapy

## 2021-07-16 DIAGNOSIS — R262 Difficulty in walking, not elsewhere classified: Secondary | ICD-10-CM | POA: Diagnosis present

## 2021-07-16 DIAGNOSIS — M25572 Pain in left ankle and joints of left foot: Secondary | ICD-10-CM | POA: Diagnosis not present

## 2021-07-16 DIAGNOSIS — M6281 Muscle weakness (generalized): Secondary | ICD-10-CM | POA: Insufficient documentation

## 2021-07-21 ENCOUNTER — Encounter: Payer: Self-pay | Admitting: Physical Therapy

## 2021-07-21 ENCOUNTER — Ambulatory Visit: Payer: BC Managed Care – PPO | Admitting: Physical Therapy

## 2021-07-21 DIAGNOSIS — M6281 Muscle weakness (generalized): Secondary | ICD-10-CM

## 2021-07-21 DIAGNOSIS — R262 Difficulty in walking, not elsewhere classified: Secondary | ICD-10-CM

## 2021-07-21 DIAGNOSIS — M25572 Pain in left ankle and joints of left foot: Secondary | ICD-10-CM | POA: Diagnosis not present

## 2021-07-21 NOTE — Therapy (Signed)
OUTPATIENT PHYSICAL THERAPY LOWER EXTREMITY EVALUATION   Patient Name: Monica Dunlap MRN: [id number] DOB:1990/03/28, 31 y.o., female Today's Date: 07/21/2021   PT End of Session - 07/21/21 1025     Visit Number 4    Number of Visits 8    Date for PT Re-Evaluation 08/25/21    Authorization Type BC[employer]    PT Start Time 1017    PT Stop Time 1105    PT Time Calculation (min) 48 min    Activity Tolerance Patient tolerated treatment well    Behavior During Therapy WFL for tasks assessed/performed                Past Medical History:  Diagnosis Date   Acne    Back pain    Prediabetes    Psoriasis    PTTD (posterior tibial tendon dysfunction)    Past Surgical History:  Procedure Laterality Date   NO PAST SURGERIES     Patient Active Problem List   Diagnosis Date Noted   Vitamin D deficiency 03/19/2021   Psoriasis 03/18/2021   Prediabetes 03/18/2021   Numbness and tingling 03/18/2021   Left ankle tendonitis 03/18/2021    PCP: McLean-Scocuzza, Nino Glow, MD  REFERRING PROVIDER: Garrel Ridgel, DPM  REFERRING DIAG: [phone] (ICD-10-CM) - Posterior tibial tendinitis of left lower extremity  THERAPY DIAG:  Pain in left ankle and joints of left foot  Muscle weakness (generalized)  Difficulty in walking, not elsewhere classified  Rationale for Evaluation and Treatment Rehabilitation  ONSET DATE: In college around 2012-214  SUBJECTIVE:   SUBJECTIVE STATEMENT: Pt is 0/10 at rest but if I am up for an hour or more I start having pain in my in my medial ankle 5/10.  I did a pilates class on Saturday and It was really helpful.   PERTINENT HISTORY: Back pain, prediabetes, vitamin D deficiency, Psoriasis, PTTD/ left ankle tendonitis  PAIN:  Are you having pain? Yes: NPRS scale: 0/10 at rest but 5/10 after standing for 1 hour or more /10 Pain location: Posterior tibialis insertiion and along muscle between navicular and malleolus Pain description: achy sharp after a  heavy work out Aggravating factors: sitting for too long 1 hour or standing for 1 hour,  Relieving factors: sitting and pressure off it Compression sleeve  PRECAUTIONS: None  WEIGHT BEARING RESTRICTIONS No  FALLS:  Has patient fallen in last 6 months? No  LIVING ENVIRONMENT: Lives with: lives alone Lives in: House/apartment Stairs: Yes: Internal: 15 steps; on left going up Has following equipment at home: None  OCCUPATION: teacher Kindergarten  PLOF: Independent  PATIENT GOALS I would love to be able to wear shoes other than tennis shoes due to the swelling.  I am still able to exercise but sometimes it hurts when I do too much. When I do a lot of things on my toes I notice that I have more pain in arch and through the arch part.  I am more aware of my pain when I am travelling or sitting or dangling my legs more   OBJECTIVE:   DIAGNOSTIC FINDINGS: IMPRESSION: 05-05-21 Subcutaneous edema along the medial ankle. No focal fluid collection.   Minimal tenosynovitis of the posterior tibial tendon at the level of the talar head/neck and as a passes beneath the medial malleolus. No acute tendon tear.   Intact ankle ligaments.  PATIENT SURVEYS:  FOTO 69% predicted 80  COGNITION:  Overall cognitive status: Within functional limits for tasks assessed     SENSATION:  WFL  EDEMA:  Figure 8: left 52.5 cm and Right is 49.75 cm  POSTURE: rounded shoulders, forward head, and anterior pelvic tilt, slender ectomorphic body type Pt with flattened left arch compared to Right but not collapsed.  PALPATION: TTP over posterior tib from navicular to left lateral malleolus  LOWER EXTREMITY ROM:   WFL  except note  Active ROM Right eval Left eval  Hip flexion    Hip extension    Hip abduction    Hip adduction    Hip internal rotation    Hip external rotation    Knee flexion    Knee extension    Ankle dorsiflexion 11 10  Ankle plantarflexion 61 60  Ankle inversion 41 41   Ankle eversion 30 26   (Blank rows = not tested)  LOWER EXTREMITY MMT:  MMT Right eval Left eval  Hip flexion    Hip extension    Hip abduction    Hip adduction    Hip internal rotation    Hip external rotation    Knee flexion    Knee extension    Ankle dorsiflexion    Ankle plantarflexion SLS 25/25  SLS 10/25   Ankle inversion    Ankle eversion     (Blank rows = not tested)   FUNCTIONAL TESTS:  5 times sit to stand: 6.37 sec   Squat- pt able to squat to about 80 degrees hip flexion but wt bears to Right to unweight left  foot to prevent collapse of medial arch  GAIT: Distance walked: 200 feet  Assistive device utilized: None Level of assistance: Complete Independence Comments: Pt with decreased  TODAY'S TREATMENT: [facility] Adult PT Treatment:                                                DATE: 07-21-21  Reverse clam 2 x 15 on R and L  Calf stretch 2 x 30 sec knee bent/ straight on slant board 4 way ankle with GTB 1x15.  Heel raise with met heads on rolled towel 2 x 15,  Heel raise with ball between ankles to promote posterior tib 1 x 20 Hip Abduction 2 x 10 Bilat  Hip Extension 2 x 10 Bilat Toe Yoga x 15 Bilat  Single limb forward floor touch with 5 lb DB 1 x 12 needs UE support 2 fingers  Trigger Point Dry-Needling  Treatment instructions: Expect mild to moderate muscle soreness. S/S of pneumothorax if dry needled over a lung field, and to seek immediate medical attention should they occur. Patient verbalized understanding of these instructions and education.  Patient Consent Given: Yes Education handout provided: Yes Muscles treated: posterior tib and gastroc on the L Electrical stimulation performed: yes for tib posterior L Parameters: 20 pps to pt tolerance for 10 min to tibialis posterior Left increasing every 2 min for pt tolerance Treatment response/outcome: twitch response and muscle lengthening. With gastroc and L tib post    [facility] Adult PT Treatment:                                                 DATE: 07/16/2021 Therapeutic Exercise: NuStep 5 min lvl 4 while taking subjective.  Bilat hip/ Bilat sidelying  ER 2 x 15 Calf stretch 2 x 30 sec knee bent/ straight on slant board 4 way ankle with RTB 1x15.  Heel raise with met heads on rolled towel 2 x 15,  Heel raise with ball between ankles to promote posterior tib 1 x 20 Hip Abduction 2 x 10 Bilat  Hip Extension 2 x 10 Bilat Toe Yoga x 15 Bilat   Trigger Point Dry-Needling  Treatment instructions: Expect mild to moderate muscle soreness. S/S of pneumothorax if dry needled over a lung field, and to seek immediate medical attention should they occur. Patient verbalized understanding of these instructions and education.  Patient Consent Given: Yes Education handout provided: Yes Muscles treated: posterior tib and gastroc on the L Electrical stimulation performed: No Parameters: N/A Treatment response/outcome: twitch response and muscle lengthening.   [facility] Adult PT Treatment:                                                DATE: 07/07/2021 Therapeutic Exercise: L hip/ L sidelying ER 2 x 15 Calf stretch 2 x 30 sec knee bent/ straight on slant board Heel raise with met heads on rolled towel 2 x 15,  Heel raise with ball between ankles to promote posterior tib 1 x 20 Manual Therapy: MTPR along the posterior tib, and taught how to perform  Trigger Point Dry-Needling  Treatment instructions: Expect mild to moderate muscle soreness. S/S of pneumothorax if dry needled over a lung field, and to seek immediate medical attention should they occur. Patient verbalized understanding of these instructions and education.  Patient Consent Given: Yes Education handout provided: Yes Muscles treated: posterior tib on the L Electrical stimulation performed: No Parameters: N/A Treatment response/outcome: twitch response and muscle lengthening.    Access Code: [email or web address] URL:  [email or web address] Date: 06/30/2021 Prepared by: Voncille Lo  Program Notes SOLE to SOLE exercise  Begin with 100 repetitions taking short rest as needed. Over two weeks work up to 300 repetitions continuously  [place] with Counter Support  - 1 x daily - 7 x weekly - 5 sets - 10 reps -   hold - Ankle Inversion with Resistance  - 1 x daily - 7 x weekly - 3 sets - 10 reps - Seated Ankle Eversion with Resistance  - 1 x daily - 7 x weekly - 3 sets - 10 reps - Gastroc Stretch on Wall  - 1 x daily - 7 x weekly - 3 reps - 30 sec hold - Soleus Stretch on Wall  - 1 x daily - 7 x weekly - 1 sets - 3 reps - 30 sec hold   PATIENT EDUCATION:  Education details: POC Explanation of findings, FOTO explanation issue HEP Person educated: Patient Education method: Explanation, Demonstration, Tactile cues, Verbal cues, and Handouts Education comprehension: verbalized understanding, returned demonstration, verbal cues required, tactile cues required, and needs further education   HOME EXERCISE PROGRAM: Access Code: [email or web address] URL: [email or web address] Date: 07/21/2021 updated Prepared by: Voncille Lo  Program Notes SOLE to SOLE exercise  Begin with 100 repetitions taking short rest as needed. Over two weeks work up to 300 repetitions continuously  [place] with Counter Support  - 1 x daily - 7 x weekly - 5 sets - 10 reps -   hold - Ankle Inversion with Resistance  - 1 x daily -  7 x weekly - 3 sets - 10 reps - Seated Ankle Eversion with Resistance  - 1 x daily - 7 x weekly - 3 sets - 10 reps - Gastroc Stretch on Wall  - 1 x daily - 7 x weekly - 3 reps - 30 sec hold - Soleus Stretch on Wall  - 1 x daily - 7 x weekly - 1 sets - 3 reps - 30 sec hold - Forward Reach with Dumbbell  - 1 x daily - 7 x weekly - 3 sets - 10 reps  ASSESSMENT:  CLINICAL IMPRESSION: Pt arrives to session reporting no pain this morning at rest but does  complain about pain up 5/10 after standing for 1 hour or more.  Pt able to Meet # 1 and # 2 STG's Pt reports walking dog a couple of times a day about 20 minutes each. Pt reports  positive reports from TPDN , continued with it this session focusing on the L posterior tib and gastroc head and then 8 min e stim with TPDN for post tib only. No adverse reactions noted. Continued working on posterior tib  with added single limb stance with Added DB weight to floor.  Pt is a little sore after this session 2-3/10 but able to do exercises.  End of session she noted minimal pain but did report feeling fatigued.   OBJECTIVE IMPAIRMENTS difficulty walking, decreased ROM, decreased strength, and pain.   ACTIVITY LIMITATIONS sitting, standing, and stairs  PARTICIPATION LIMITATIONS: driving, occupation, and traveling and sitting for more than 1 hours  PERSONAL FACTORS  long history of PTTD since 2012/2014 without resolving pain  are also affecting patient's functional outcome.   REHAB POTENTIAL: Good  CLINICAL DECISION MAKING: Stable/uncomplicated  EVALUATION COMPLEXITY: Low   GOALS: Goals reviewed with patient? Yes  SHORT TERM GOALS: Target date: 08/03/2021  Pt will be independent with initial HEP Baseline:no knowledge Goal status: MET  2.  Pt when sitting for 1 hour will not have pain greater than 3/10 Baseline:  5/10 after sitting for 1 hour, 07-21-21 sits and watches show   Goal status: MET  3.  Pt will be more aware of her gait and bearing weight through her 1st ray/great toe ball of foot Baseline:  Pt tends to collapse medial arch when walking Goal status: ONGOING    LONG TERM GOALS: Target date: 08/31/2021   Pt will be independent with advanced HEP including lifting heavier weight  with out increasing pain in medial left arch Baseline:  Pt feels pain in medial arch when lifting more than 25 # Goal status: INITIAL  2.  FOTO will improve from  69%  to  80%   indicating improved  functional mobility .  Baseline: Eval 69% Goal status: INITIAL  3.  Pt will be able to tolerate shoes other than flat tennis shoes for 1 hour Baseline: unable to tolerate any shoes besides flat tennis shoes Goal status: INITIAL  4.  Pt will be able to sit and travel for 2 hours with minimal exacerbation of pain Baseline:  5/10 after 1 hour of sitting Goal status: INITIAL  5.  Pt will be able to stand for 2 hours with minimal exacerbation of pain Baseline:  5/10 after 1 hours of standing /teaching Goal status: INITIAL  6.  Pt will be able to SLS on left to 35 x without pain greater than 1/10 Baseline: 10/25  1/10 on eval Goal status: INITIAL   PLAN: PT FREQUENCY: 1x/week  PT DURATION: 8 weeks  PLANNED INTERVENTIONS: Therapeutic exercises, Therapeutic activity, Neuromuscular re-education, Balance training, Gait training, Patient/Family education, Joint mobilization, Stair training, Dry Needling, Electrical stimulation, Cryotherapy, Moist heat, Taping, Vasopneumatic device, Ionotophoresis 70m/ml Dexamethasone, Manual therapy, and Re-evaluation  PLAN FOR NEXT SESSION: REview HEP, possible TPDN of posterior tibialis  Loading for post tib   LVoncille Lo PT, AWalloon LakeCertified Exercise Expert for the Aging Adult  07/21/21 11:14 AM Phone: [phone]: [phone]

## 2021-07-27 NOTE — Therapy (Signed)
OUTPATIENT PHYSICAL THERAPY LOWER EXTREMITY EVALUATION   Patient Name: Monica Dunlap MRN: [id number] DOB:1990-05-31, 31 y.o., female Today's Date: 07/28/2021   PT End of Session - 07/28/21 1019     Visit Number 5    Number of Visits 8    Date for PT Re-Evaluation 08/25/21    Authorization Type BCBS    PT Start Time 1019    PT Stop Time 1100    PT Time Calculation (min) 41 min                 Past Medical History:  Diagnosis Date   Acne    Back pain    Prediabetes    Psoriasis    PTTD (posterior tibial tendon dysfunction)    Past Surgical History:  Procedure Laterality Date   NO PAST SURGERIES     Patient Active Problem List   Diagnosis Date Noted   Vitamin D deficiency 03/19/2021   Psoriasis 03/18/2021   Prediabetes 03/18/2021   Numbness and tingling 03/18/2021   Left ankle tendonitis 03/18/2021    PCP: McLean-Scocuzza, Nino Glow, MD  REFERRING PROVIDER: Garrel Ridgel, DPM  REFERRING DIAG: [phone] (ICD-10-CM) - Posterior tibial tendinitis of left lower extremity  THERAPY DIAG:  Pain in left ankle and joints of left foot  Muscle weakness (generalized)  Difficulty in walking, not elsewhere classified  Rationale for Evaluation and Treatment Rehabilitation  ONSET DATE: In college around 2012-214  SUBJECTIVE:   SUBJECTIVE STATEMENT:  I was out at [employer] and stayed about 5 hours and I was sore when I got home but it was bearable.  I did rest and did ok.   PERTINENT HISTORY: Back pain, prediabetes, vitamin D deficiency, Psoriasis, PTTD/ left ankle tendonitis  PAIN:  Are you having pain? Yes: NPRS scale: 0/10 at rest but 5/10 after standing for 1 hour or more /10 Pain location: Posterior tibialis insertiion and along muscle between navicular and malleolus Pain description: achy sharp after a heavy work out Aggravating factors: sitting for too long 1 hour or standing for 1 hour,  Relieving factors: sitting and pressure off  it Compression sleeve  PRECAUTIONS: None  WEIGHT BEARING RESTRICTIONS No  FALLS:  Has patient fallen in last 6 months? No  LIVING ENVIRONMENT: Lives with: lives alone Lives in: House/apartment Stairs: Yes: Internal: 15 steps; on left going up Has following equipment at home: None  OCCUPATION: teacher Kindergarten  PLOF: Independent  PATIENT GOALS I would love to be able to wear shoes other than tennis shoes due to the swelling.  I am still able to exercise but sometimes it hurts when I do too much. When I do a lot of things on my toes I notice that I have more pain in arch and through the arch part.  I am more aware of my pain when I am travelling or sitting or dangling my legs more   OBJECTIVE:   DIAGNOSTIC FINDINGS: IMPRESSION: 05-05-21 Subcutaneous edema along the medial ankle. No focal fluid collection.   Minimal tenosynovitis of the posterior tibial tendon at the level of the talar head/neck and as a passes beneath the medial malleolus. No acute tendon tear.   Intact ankle ligaments.  PATIENT SURVEYS:  FOTO 69% predicted 80 FOTO 07-28-21 80% predicted 80%  COGNITION:  Overall cognitive status: Within functional limits for tasks assessed     SENSATION: WFL  EDEMA:  Figure 8: left 52.5 cm and Right is 49.75 cm  POSTURE: rounded shoulders, forward  head, and anterior pelvic tilt, slender ectomorphic body type Pt with flattened left arch compared to Right but not collapsed.  PALPATION: TTP over posterior tib from navicular to left lateral malleolus  LOWER EXTREMITY ROM:   WFL  except note  Active ROM Right eval Left eval  Hip flexion    Hip extension    Hip abduction    Hip adduction    Hip internal rotation    Hip external rotation    Knee flexion    Knee extension    Ankle dorsiflexion 11 10  Ankle plantarflexion 61 60  Ankle inversion 41 41  Ankle eversion 30 26   (Blank rows = not tested)  LOWER EXTREMITY MMT:  MMT Right eval Left eval   Hip flexion    Hip extension    Hip abduction    Hip adduction    Hip internal rotation    Hip external rotation    Knee flexion    Knee extension    Ankle dorsiflexion    Ankle plantarflexion SLS 25/25  SLS 10/25   Ankle inversion    Ankle eversion     (Blank rows = not tested)   FUNCTIONAL TESTS:  5 times sit to stand: 6.37 sec   Squat- pt able to squat to about 80 degrees hip flexion but wt bears to Right to unweight left  foot to prevent collapse of medial arch  GAIT: Distance walked: 200 feet  Assistive device utilized: None Level of assistance: Complete Independence Comments: Pt with decreased  TODAY'S TREATMENT: [facility] Adult PT Treatment:                                                DATE: 07-28-21 Therapeutic Exercise: Reverse clam 2 x 15 on R and L  Tiptoe walk with 10 lb on each UE for 80 feet x 2 [place] split squat on left with 10 lb each UE (total 20)3 x 10  Calf stretch 2 x 30 sec knee bent/ straight on slant board Heel raise with met heads on rolled towel 2 x 15,  Knee bent heel raise 2 x 15 on left only with towel under toes Heel raise with ball between ankles to promote posterior tib 1 x 20 Eccentric lowering of SLS on L 2 x 10 Deep squat on incline board 3x 10 Single limb forward floor touch with 10 lb DB 1 x 12 needs UE support 2 fingers Manual Therapy: STW-   PTTD with IASTYM and gastroc of L LE Treatment instructions: Expect mild to moderate muscle soreness. S/S of pneumothorax if dry needled over a lung field, and to seek immediate medical attention should they occur. Patient verbalized understanding of these instructions and education.  Patient Consent Given: Yes Education handout provided: Yes  Muscles treated: posterior tib and gastroc on the L Electrical stimulation performed: yes for tib posterior L Parameters: 20 pps to pt tolerance for 10 min to tibialis posterior Left increasing every 2 min for pt tolerance Treatment response/outcome: twitch  response and muscle lengthening. With gastroc and L tib post   Modalities: Moist hot pack to left LE   [facility] Adult PT Treatment:  DATE: 07-21-21  Reverse clam 2 x 15 on R and L  Calf stretch 2 x 30 sec knee bent/ straight on slant board 4 way ankle with GTB 1x15.  Heel raise with met heads on rolled towel 2 x 15,  Heel raise with ball between ankles to promote posterior tib 1 x 20 Hip Abduction 2 x 10 Bilat  Hip Extension 2 x 10 Bilat Toe Yoga x 15 Bilat  Single limb forward floor touch with 5 lb DB 1 x 12 needs UE support 2 fingers  Trigger Point Dry-Needling  Treatment instructions: Expect mild to moderate muscle soreness. S/S of pneumothorax if dry needled over a lung field, and to seek immediate medical attention should they occur. Patient verbalized understanding of these instructions and education.  Patient Consent Given: Yes Education handout provided: Yes Muscles treated: posterior tib and gastroc on the L Electrical stimulation performed: yes for tib posterior L Parameters: 20 pps to pt tolerance for 10 min to tibialis posterior Left increasing every 2 min for pt tolerance Treatment response/outcome: twitch response and muscle lengthening. With gastroc and L tib post    [facility] Adult PT Treatment:                                                DATE: 07/16/2021 Therapeutic Exercise: NuStep 5 min lvl 4 while taking subjective.  Bilat hip/ Bilat sidelying ER 2 x 15 Calf stretch 2 x 30 sec knee bent/ straight on slant board 4 way ankle with RTB 1x15.  Heel raise with met heads on rolled towel 2 x 15,  Heel raise with ball between ankles to promote posterior tib 1 x 20 Hip Abduction 2 x 10 Bilat  Hip Extension 2 x 10 Bilat Toe Yoga x 15 Bilat   Trigger Point Dry-Needling  Treatment instructions: Expect mild to moderate muscle soreness. S/S of pneumothorax if dry needled over a lung field, and to seek immediate medical attention  should they occur. Patient verbalized understanding of these instructions and education.  Patient Consent Given: Yes Education handout provided: Yes Muscles treated: posterior tib and gastroc on the L Electrical stimulation performed: No Parameters: N/A Treatment response/outcome: twitch response and muscle lengthening.   [facility] Adult PT Treatment:                                                DATE: 07/07/2021 Therapeutic Exercise: L hip/ L sidelying ER 2 x 15 Calf stretch 2 x 30 sec knee bent/ straight on slant board Heel raise with met heads on rolled towel 2 x 15,  Heel raise with ball between ankles to promote posterior tib 1 x 20 Manual Therapy: MTPR along the posterior tib, and taught how to perform  Trigger Point Dry-Needling  Treatment instructions: Expect mild to moderate muscle soreness. S/S of pneumothorax if dry needled over a lung field, and to seek immediate medical attention should they occur. Patient verbalized understanding of these instructions and education.  Patient Consent Given: Yes Education handout provided: Yes Muscles treated: posterior tib on the L Electrical stimulation performed: No Parameters: N/A Treatment response/outcome: twitch response and muscle lengthening.    Access Code: [email or web address] Access Code: [email or web address] URL: [email or web address] Date: 07/28/2021 Prepared by:  Voncille Lo  Program Notes SOLE to SOLE exercise  Begin with 100 repetitions taking short rest as needed. Over two weeks work up to 300 repetitions continuouslyToe walking Eccentric SLS lowering [place] split squats with weights  Exercises - Heel Raises with Counter Support  - 1 x daily - 7 x weekly - 5 sets - 10 reps -   hold - Ankle Inversion with Resistance  - 1 x daily - 7 x weekly - 3 sets - 10 reps - Seated Ankle Eversion with Resistance  - 1 x daily - 7 x weekly - 3 sets - 10 reps - Gastroc Stretch on Wall  - 1 x daily - 7 x weekly - 3 reps - 30 sec hold -  Soleus Stretch on Wall  - 1 x daily - 7 x weekly - 1 sets - 3 reps - 30 sec hold - Forward Reach with Dumbbell  - 1 x daily - 7 x weekly - 3 sets - 10 reps   PATIENT EDUCATION:  Education details: POC Explanation of findings, FOTO explanation issue HEP Person educated: Patient Education method: Explanation, Demonstration, Tactile cues, Verbal cues, and Handouts Education comprehension: verbalized understanding, returned demonstration, verbal cues required, tactile cues required, and needs further education   HOME EXERCISE PROGRAM: Access Code: [email or web address] URL: [email or web address] Date: 07/21/2021 updated Prepared by: Voncille Lo  Program Notes SOLE to SOLE exercise  Begin with 100 repetitions taking short rest as needed. Over two weeks work up to 300 repetitions continuously  Exercises - Heel Raises with Counter Support  - 1 x daily - 7 x weekly - 5 sets - 10 reps -   hold - Ankle Inversion with Resistance  - 1 x daily - 7 x weekly - 3 sets - 10 reps - Seated Ankle Eversion with Resistance  - 1 x daily - 7 x weekly - 3 sets - 10 reps - Gastroc Stretch on Wall  - 1 x daily - 7 x weekly - 3 reps - 30 sec hold - Soleus Stretch on Wall  - 1 x daily - 7 x weekly - 1 sets - 3 reps - 30 sec hold - Forward Reach with Dumbbell  - 1 x daily - 7 x weekly - 3 sets - 10 reps  ASSESSMENT:  CLINICAL IMPRESSION:  Monica Dunlap enters clinic with minimal pain and was able to walk around [place] 5 hours [place] [employer] [place] , but she was sore after she got back home. Pt is tolerating more load and should be able to complete goals at end POC. FOTO goal met at 80%  All STG achieved and # 2 achieved  LTG  Pt arrives to session reporting no pain this morning at rest but does complain about pain up 5/10 after standing for 1 hour or more.  Pt able to Meet # 1 and # 2 STG's Pt reports walking dog a couple of times a day about 20 minutes each. Pt reports  positive reports from TPDN , continued with  it this session focusing on the L posterior tib and gastroc head and then 8 min e stim with TPDN for post tib only. No adverse reactions noted. Continued working on posterior tib  with added single limb stance with Added DB weight to floor.  Pt is a little sore after this session 2-3/10 but able to do exercises.  End of session she noted minimal pain but did report feeling fatigued.   OBJECTIVE IMPAIRMENTS difficulty walking, decreased ROM, decreased  strength, and pain.   ACTIVITY LIMITATIONS sitting, standing, and stairs  PARTICIPATION LIMITATIONS: driving, occupation, and traveling and sitting for more than 1 hours  PERSONAL FACTORS  long history of PTTD since 2012/2014 without resolving pain  are also affecting patient's functional outcome.   REHAB POTENTIAL: Good  CLINICAL DECISION MAKING: Stable/uncomplicated  EVALUATION COMPLEXITY: Low   GOALS: Goals reviewed with patient? Yes  SHORT TERM GOALS: Target date: 08/03/2021  Pt will be independent with initial HEP Baseline:no knowledge Goal status: MET  2.  Pt when sitting for 1 hour will not have pain greater than 3/10 Baseline:  5/10 after sitting for 1 hour, 07-21-21 sits and watches show   Goal status: MET  3.  Pt will be more aware of her gait and bearing weight through her 1st ray/great toe ball of foot Baseline:  Pt tends to collapse medial arch when walking. Pt with improved arch while exercising including 1st ray contact with floor Goal status: MET    LONG TERM GOALS: Target date: 08/31/2021   Pt will be independent with advanced HEP including lifting heavier weight  with out increasing pain in medial left arch Baseline:  Pt feels pain in medial arch when lifting more than 25 # Goal status: ONGOING  2.  FOTO will improve from  69%  to  80%   indicating improved functional mobility .  Baseline: Eval 69% 07-28-21  80% Goal status: MET  3.  Pt will be able to tolerate shoes other than flat tennis shoes for 1  hour Baseline: unable to tolerate any shoes besides flat tennis shoes Goal status: ONGOING  4.  Pt will be able to sit and travel for 2 hours with minimal exacerbation of pain Baseline:  5/10 after 1 hour of sitting Goal status: ONGOING  5.  Pt will be able to stand for 2 hours with minimal exacerbation of pain Baseline:  5/10 after 1 hours of standing /teaching Goal status: ONGOING  6.  Pt will be able to SLS on left to 35 x without pain greater than 1/10 Baseline: 10/25  1/10 on eval Goal status: ONGOING   PLAN: PT FREQUENCY: 1x/week  PT DURATION: 8 weeks  PLANNED INTERVENTIONS: Therapeutic exercises, Therapeutic activity, Neuromuscular re-education, Balance training, Gait training, Patient/Family education, Joint mobilization, Stair training, Dry Needling, Electrical stimulation, Cryotherapy, Moist heat, Taping, Vasopneumatic device, Ionotophoresis 15m/ml Dexamethasone, Manual therapy, and Re-evaluation  PLAN FOR NEXT SESSION: REview HEP, possible TPDN of posterior tibialis  Loading for post tib   LVoncille Lo PT, ABridgeportCertified Exercise Expert for the Aging Adult  07/28/21 2:45 PM Phone: [phone]: [phone]

## 2021-07-28 ENCOUNTER — Encounter: Payer: Self-pay | Admitting: Physical Therapy

## 2021-07-28 ENCOUNTER — Ambulatory Visit: Payer: BC Managed Care – PPO | Admitting: Physical Therapy

## 2021-07-28 DIAGNOSIS — M6281 Muscle weakness (generalized): Secondary | ICD-10-CM

## 2021-07-28 DIAGNOSIS — R262 Difficulty in walking, not elsewhere classified: Secondary | ICD-10-CM

## 2021-07-28 DIAGNOSIS — M25572 Pain in left ankle and joints of left foot: Secondary | ICD-10-CM | POA: Diagnosis not present

## 2021-07-28 NOTE — Patient Instructions (Signed)
Access Code: [email or web address] URL: [email or web address] Date: 07/28/2021 Prepared by: Garen Lah  Program Notes SOLE to SOLE exercise  Begin with 100 repetitions taking short rest as needed. Over two weeks work up to 300 repetitions continuouslyToe walking Eccentric SLS lowering [place] split squats with weights  Exercises - Heel Raises with Counter Support  - 1 x daily - 7 x weekly - 5 sets - 10 reps -   hold - Ankle Inversion with Resistance  - 1 x daily - 7 x weekly - 3 sets - 10 reps - Seated Ankle Eversion with Resistance  - 1 x daily - 7 x weekly - 3 sets - 10 reps - Gastroc Stretch on Wall  - 1 x daily - 7 x weekly - 3 reps - 30 sec hold - Soleus Stretch on Wall  - 1 x daily - 7 x weekly - 1 sets - 3 reps - 30 sec hold - Forward Reach with Dumbbell  - 1 x daily - 7 x weekly - 3 sets - 10 reps Garen Lah, PT, ATRIC Certified Exercise Expert for the Aging Adult  07/28/21 11:05 AM Phone: [phone] Fax: [phone]

## 2021-08-04 ENCOUNTER — Ambulatory Visit: Payer: BC Managed Care – PPO | Admitting: Physical Therapy

## 2021-08-04 ENCOUNTER — Encounter: Payer: Self-pay | Admitting: Physical Therapy

## 2021-08-04 DIAGNOSIS — M25572 Pain in left ankle and joints of left foot: Secondary | ICD-10-CM | POA: Diagnosis not present

## 2021-08-04 DIAGNOSIS — M6281 Muscle weakness (generalized): Secondary | ICD-10-CM

## 2021-08-04 DIAGNOSIS — R262 Difficulty in walking, not elsewhere classified: Secondary | ICD-10-CM

## 2021-08-04 NOTE — Therapy (Addendum)
OUTPATIENT PHYSICAL THERAPY LOWER EXTREMITY EVALUATION PHYSICAL THERAPY DISCHARGE SUMMARY  Visits from Start of Care: 6  Current functional level related to goals / functional outcomes: unknown   Remaining deficits: unknown   Education / Equipment: HEP   Patient agrees to discharge. Patient goals were partially met. Patient is being discharged due to not returning since the last visit.      Patient Name: Monica Dunlap MRN: [id number] DOB:May 25, 1990, 31 y.o., female Today's Date: 08/04/2021   PT End of Session - 08/04/21 1022     Visit Number 6    Number of Visits 8    Date for PT Re-Evaluation 08/25/21    Authorization Type BCBS    PT Start Time 1020    PT Stop Time 1100    PT Time Calculation (min) 40 min    Activity Tolerance Patient tolerated treatment well    Behavior During Therapy WFL for tasks assessed/performed                  Past Medical History:  Diagnosis Date   Acne    Back pain    Prediabetes    Psoriasis    PTTD (posterior tibial tendon dysfunction)    Past Surgical History:  Procedure Laterality Date   NO PAST SURGERIES     Patient Active Problem List   Diagnosis Date Noted   Vitamin D deficiency 03/19/2021   Psoriasis 03/18/2021   Prediabetes 03/18/2021   Numbness and tingling 03/18/2021   Left ankle tendonitis 03/18/2021    PCP: McLean-Scocuzza, Nino Glow, MD  REFERRING PROVIDER: Garrel Ridgel, DPM  REFERRING DIAG: [phone] (ICD-10-CM) - Posterior tibial tendinitis of left lower extremity  THERAPY DIAG:  Pain in left ankle and joints of left foot  Muscle weakness (generalized)  Difficulty in walking, not elsewhere classified  Rationale for Evaluation and Treatment Rehabilitation  ONSET DATE: In college around 2012-214  SUBJECTIVE:   SUBJECTIVE STATEMENT:  I was more sore yesterday because I was at a funeral and on my feet more. Most of the day. We sat down for the funeral service but I was on my feet talking with  family. I was more sore last night and I noticed swelling. I did wear a sandal with a small heel  and I I wore my tennis shoes at night. I am 2/10 today.     PERTINENT HISTORY: Back pain, prediabetes, vitamin D deficiency, Psoriasis, PTTD/ left ankle tendonitis  PAIN:  Are you having pain? Yes: NPRS scale: 0/10 at rest but 5/10 after standing for 1 hour or more /10 Pain location: Posterior tibialis insertiion and along muscle between navicular and malleolus Pain description: achy sharp after a heavy work out Aggravating factors: sitting for too long 1 hour or standing for 1 hour,  Relieving factors: sitting and pressure off it Compression sleeve  PRECAUTIONS: None  WEIGHT BEARING RESTRICTIONS No  FALLS:  Has patient fallen in last 6 months? No  LIVING ENVIRONMENT: Lives with: lives alone Lives in: House/apartment Stairs: Yes: Internal: 15 steps; on left going up Has following equipment at home: None  OCCUPATION: teacher Kindergarten  PLOF: Independent  PATIENT GOALS I would love to be able to wear shoes other than tennis shoes due to the swelling.  I am still able to exercise but sometimes it hurts when I do too much. When I do a lot of things on my toes I notice that I have more pain in arch and through the arch part.  I  am more aware of my pain when I am travelling or sitting or dangling my legs more   OBJECTIVE:   DIAGNOSTIC FINDINGS: IMPRESSION: 05-05-21 Subcutaneous edema along the medial ankle. No focal fluid collection.   Minimal tenosynovitis of the posterior tibial tendon at the level of the talar head/neck and as a passes beneath the medial malleolus. No acute tendon tear.   Intact ankle ligaments.  PATIENT SURVEYS:  FOTO 69% predicted 80 FOTO 07-28-21 80% predicted 80%  COGNITION:  Overall cognitive status: Within functional limits for tasks assessed     SENSATION: WFL  EDEMA:  Figure 8: left 52.5 cm and Right is 49.75 cm  POSTURE: rounded  shoulders, forward head, and anterior pelvic tilt, slender ectomorphic body type Pt with flattened left arch compared to Right but not collapsed.  PALPATION: TTP over posterior tib from navicular to left lateral malleolus  LOWER EXTREMITY ROM:   WFL  except note  Active ROM Right eval Left eval  Hip flexion    Hip extension    Hip abduction    Hip adduction    Hip internal rotation    Hip external rotation    Knee flexion    Knee extension    Ankle dorsiflexion 11 10  Ankle plantarflexion 61 60  Ankle inversion 41 41  Ankle eversion 30 26   (Blank rows = not tested)  LOWER EXTREMITY MMT:  MMT Right eval Left eval Left 08-04-21  Hip flexion     Hip extension     Hip abduction     Hip adduction     Hip internal rotation     Hip external rotation     Knee flexion     Knee extension     Ankle dorsiflexion     Ankle plantarflexion SLS 25/25  SLS 10/25  SLS 25/25  Ankle inversion     Ankle eversion      (Blank rows = not tested)   FUNCTIONAL TESTS:  5 times sit to stand: 6.37 sec   Squat- pt able to squat to about 80 degrees hip flexion but wt bears to Right to unweight left  foot to prevent collapse of medial arch  GAIT: Distance walked: 200 feet  Assistive device utilized: None Level of assistance: Complete Independence Comments: Pt with decreased  TODAY'S TREATMENT: [facility] Adult PT Treatment:                                                DATE: 08-04-21 Therapeutic Exercise: 25/25 SLS on L Step ups wit 25 # KB on L 3 x 10 Deadlift with 30 # 2 x 10  25 # KB forward lunge on R and L 3 x 10 each Walking plank with added toe flex/DF x 10 Tiptoe walk with 15 lb on each UE for 120 feet x 2 Heel raise with tennis ball press  35 x 3 Deep squat with wt shifting.  I feel my left foot feels achy.   Manual   IASTYM over the PTT L   [facility] Adult PT Treatment:                                                DATE: 07-28-21 Therapeutic  Exercise: Reverse clam 2 x 15 on R  and L  Tiptoe walk with 10 lb on each UE for 80 feet x 2 [place] split squat on left with 10 lb each UE (total 20)3 x 10  Calf stretch 2 x 30 sec knee bent/ straight on slant board Heel raise with met heads on rolled towel 2 x 15,  Knee bent heel raise 2 x 15 on left only with towel under toes Heel raise with ball between ankles to promote posterior tib 1 x 20 Eccentric lowering of SLS on L 2 x 10 Deep squat on incline board 3x 10 Single limb forward floor touch with 10 lb DB 1 x 12 needs UE support 2 fingers Manual Therapy: STW-   PTTD with IASTYM and gastroc of L LE Treatment instructions: Expect mild to moderate muscle soreness. S/S of pneumothorax if dry needled over a lung field, and to seek immediate medical attention should they occur. Patient verbalized understanding of these instructions and education.  Patient Consent Given: Yes Education handout provided: Yes  Muscles treated: posterior tib and gastroc on the L Electrical stimulation performed: yes for tib posterior L Parameters: 20 pps to pt tolerance for 10 min to tibialis posterior Left increasing every 2 min for pt tolerance Treatment response/outcome: twitch response and muscle lengthening. With gastroc and L tib post   Modalities: Moist hot pack to left LE   [facility] Adult PT Treatment:                                                DATE: 07-21-21  Reverse clam 2 x 15 on R and L  Calf stretch 2 x 30 sec knee bent/ straight on slant board 4 way ankle with GTB 1x15.  Heel raise with met heads on rolled towel 2 x 15,  Heel raise with ball between ankles to promote posterior tib 1 x 20 Hip Abduction 2 x 10 Bilat  Hip Extension 2 x 10 Bilat Toe Yoga x 15 Bilat  Single limb forward floor touch with 5 lb DB 1 x 12 needs UE support 2 fingers  Trigger Point Dry-Needling  Treatment instructions: Expect mild to moderate muscle soreness. S/S of pneumothorax if dry needled over a lung field, and to seek immediate medical attention  should they occur. Patient verbalized understanding of these instructions and education.  Patient Consent Given: Yes Education handout provided: Yes Muscles treated: posterior tib and gastroc on the L Electrical stimulation performed: yes for tib posterior L Parameters: 20 pps to pt tolerance for 10 min to tibialis posterior Left increasing every 2 min for pt tolerance Treatment response/outcome: twitch response and muscle lengthening. With gastroc and L tib post    [facility] Adult PT Treatment:                                                DATE: 07/16/2021 Therapeutic Exercise: NuStep 5 min lvl 4 while taking subjective.  Bilat hip/ Bilat sidelying ER 2 x 15 Calf stretch 2 x 30 sec knee bent/ straight on slant board 4 way ankle with RTB 1x15.  Heel raise with met heads on rolled towel 2 x 15,  Heel raise with ball between ankles to  promote posterior tib 1 x 20 Hip Abduction 2 x 10 Bilat  Hip Extension 2 x 10 Bilat Toe Yoga x 15 Bilat   Trigger Point Dry-Needling  Treatment instructions: Expect mild to moderate muscle soreness. S/S of pneumothorax if dry needled over a lung field, and to seek immediate medical attention should they occur. Patient verbalized understanding of these instructions and education.  Patient Consent Given: Yes Education handout provided: Yes Muscles treated: posterior tib and gastroc on the L Electrical stimulation performed: No Parameters: N/A Treatment response/outcome: twitch response and muscle lengthening.   [facility] Adult PT Treatment:                                                DATE: 07/07/2021 Therapeutic Exercise: L hip/ L sidelying ER 2 x 15 Calf stretch 2 x 30 sec knee bent/ straight on slant board Heel raise with met heads on rolled towel 2 x 15,  Heel raise with ball between ankles to promote posterior tib 1 x 20 Manual Therapy: MTPR along the posterior tib, and taught how to perform  Trigger Point Dry-Needling  Treatment instructions: Expect  mild to moderate muscle soreness. S/S of pneumothorax if dry needled over a lung field, and to seek immediate medical attention should they occur. Patient verbalized understanding of these instructions and education.  Patient Consent Given: Yes Education handout provided: Yes Muscles treated: posterior tib on the L Electrical stimulation performed: No Parameters: N/A Treatment response/outcome: twitch response and muscle lengthening.    Access Code: [email or web address] Access Code: [email or web address] URL: [email or web address] Date: 07/28/2021 Prepared by: Voncille Lo  Program Notes SOLE to SOLE exercise  Begin with 100 repetitions taking short rest as needed. Over two weeks work up to 300 repetitions continuouslyToe walking Eccentric SLS lowering [place] split squats with weights  Exercises - Heel Raises with Counter Support  - 1 x daily - 7 x weekly - 5 sets - 10 reps -   hold - Ankle Inversion with Resistance  - 1 x daily - 7 x weekly - 3 sets - 10 reps - Seated Ankle Eversion with Resistance  - 1 x daily - 7 x weekly - 3 sets - 10 reps - Gastroc Stretch on Wall  - 1 x daily - 7 x weekly - 3 reps - 30 sec hold - [place] Stretch on Wall  - 1 x daily - 7 x weekly - 1 sets - 3 reps - 30 sec hold - Forward Reach with Dumbbell  - 1 x daily - 7 x weekly - 3 sets - 10 reps   PATIENT EDUCATION:  Education details: POC Explanation of findings, FOTO explanation issue HEP Person educated: Patient Education method: Explanation, Demonstration, Tactile cues, Verbal cues, and Handouts Education comprehension: verbalized understanding, returned demonstration, verbal cues required, tactile cues required, and needs further education   HOME EXERCISE PROGRAM: Access Code: [email or web address] URL: [email or web address] Date: 07/21/2021 updated Prepared by: Voncille Lo  Program Notes SOLE to SOLE exercise  Begin with 100 repetitions taking short rest as needed. Over two weeks work up to  300 repetitions continuously  [place] with Counter Support  - 1 x daily - 7 x weekly - 5 sets - 10 reps -   hold - Ankle Inversion with Resistance  - 1 x daily - 7 x weekly - 3 sets - 10 reps -  Seated Ankle Eversion with Resistance  - 1 x daily - 7 x weekly - 3 sets - 10 reps - Gastroc Stretch on Wall  - 1 x daily - 7 x weekly - 3 reps - 30 sec hold - S[place]us Stretch on Wall  - 1 x daily - 7 x weekly - 1 sets - 3 reps - 30 sec hold - Forward Reach with Dumbbell  - 1 x daily - 7 x weekly - 3 sets - 10 reps  ASSESSMENT:  CLINICAL IMPRESSION:  Berdina returns to PT clinic after standing for most of day at family funeral yesterday. Pt was able to wear sandal with small heel. Pt was able to SL heel raise 35 x 3. LTG #4 and 6 met today. Pt is able to  sit for at least 2 hours with minimal pain.  Pt Rx concentrates on LE strength and progressive loading.  Pt is going to go on vacation cruise and will return for remaining appt.  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 Pt with  1 - 2/10 at end of session. Pt is progressing toward completion of all goals.  OBJECTIVE IMPAIRMENTS difficulty walking, decreased ROM, decreased strength, and pain.   ACTIVITY LIMITATIONS sitting, standing, and stairs  PARTICIPATION LIMITATIONS: driving, occupation, and traveling and sitting for more than 1 hours  PERSONAL FACTORS  long history of PTTD since 2012/2014 without resolving pain  are also affecting patient's functional outcome.   REHAB POTENTIAL: Good  CLINICAL DECISION MAKING: Stable/uncomplicated  EVALUATION COMPLEXITY: Low   GOALS: Goals reviewed with patient? Yes  SHORT TERM GOALS: Target date: 08/03/2021  Pt will be independent with initial HEP Baseline:no knowledge Goal status: MET  2.  Pt when sitting for 1 hour will not have pain greater than 3/10 Baseline:  5/10 after sitting for 1 hour, 07-21-21  sits and watches show   Goal status: MET  3.  Pt will be more aware of her gait and bearing weight through her 1st ray/great toe ball of foot Baseline:  Pt tends to collapse medial arch when walking. Pt with improved arch while exercising including 1st ray contact with floor Goal status: MET    LONG TERM GOALS: Target date: 08/31/2021   Pt will be independent with advanced HEP including lifting heavier weight  with out increasing pain in medial left arch Baseline:  Pt feels pain in medial arch when lifting more than 25 # Goal status: ONGOING  2.  FOTO will improve from  69%  to  80%   indicating improved functional mobility .  Baseline: Eval 69% 07-28-21  80% Goal status: MET  3.  Pt will be able to tolerate shoes other than flat tennis shoes for 1 hour Baseline: unable to tolerate any shoes besides flat tennis shoes Goal status: ONGOING  4.  Pt will be able to sit and travel for 2 hours with minimal exacerbation of pain Baseline:  5/10 after 1 hour of sitting  08-04-21  Minimal pain with sitting for 2 hours Goal status: MET  5.  Pt will be able to stand for 2 hours with minimal exacerbation of pain Baseline:  5/10 after 1 hours of standing /teaching Goal status: ONGOING  6.  Pt will be able to SLS on left to 35 x without pain greater than 1/10 Baseline: 10/25  1/10 on eval  08-04-21  35 x 3  1/10 Goal status: MET  PLAN: PT FREQUENCY: 1x/week  PT DURATION: 8 weeks  PLANNED INTERVENTIONS: Therapeutic exercises, Therapeutic activity, Neuromuscular re-education, Balance training, Gait training, Patient/Family education, Joint mobilization, Stair training, Dry Needling, Electrical stimulation, Cryotherapy, Moist heat, Taping, Vasopneumatic device, Ionotophoresis 66m/ml Dexamethasone, Manual therapy, and Re-evaluation  PLAN FOR NEXT SESSION: REview HEP, possible TPDN of posterior tibialis  Loading for post tib   LVoncille Lo PT, ACulebraCertified Exercise Expert for the  Aging Adult  08/04/21 2:18 PM Phone: [phone]: [phone]

## 2021-08-18 ENCOUNTER — Ambulatory Visit: Payer: BC Managed Care – PPO | Admitting: Physical Therapy

## 2021-08-25 ENCOUNTER — Ambulatory Visit: Payer: BC Managed Care – PPO | Admitting: Physical Therapy

## 2021-12-22 ENCOUNTER — Ambulatory Visit
Admission: EM | Admit: 2021-12-22 | Discharge: 2021-12-22 | Disposition: A | Discharge status: Home / Self Care | Payer: BC Managed Care – PPO | Attending: Emergency Medicine | Admitting: Emergency Medicine

## 2021-12-22 DIAGNOSIS — R051 Acute cough: Secondary | ICD-10-CM | POA: Insufficient documentation

## 2021-12-22 DIAGNOSIS — R030 Elevated blood-pressure reading, without diagnosis of hypertension: Secondary | ICD-10-CM | POA: Diagnosis present

## 2021-12-22 DIAGNOSIS — J101 Influenza due to other identified influenza virus with other respiratory manifestations: Secondary | ICD-10-CM | POA: Insufficient documentation

## 2021-12-22 DIAGNOSIS — R059 Cough, unspecified: Secondary | ICD-10-CM | POA: Insufficient documentation

## 2021-12-22 DIAGNOSIS — Z1152 Encounter for screening for COVID-19: Secondary | ICD-10-CM | POA: Diagnosis not present

## 2021-12-22 DIAGNOSIS — B349 Viral infection, unspecified: Secondary | ICD-10-CM | POA: Insufficient documentation

## 2021-12-22 LAB — RESP PANEL BY RT-PCR (FLU A&B, COVID) ARPGX2
Influenza A by PCR: POSITIVE — AB
Influenza B by PCR: NEGATIVE
SARS Coronavirus 2 by RT PCR: NEGATIVE

## 2021-12-22 NOTE — ED Provider Notes (Signed)
Monica Dunlap    CSN: [id number] Arrival date & time: 12/22/21  0919      History   Chief Complaint Chief Complaint  Patient presents with   Cough    Cold/flu symptoms for several days - Entered by patient    HPI Monica Dunlap is a 31 y.o. female.  Patient presents with 5 day history of congestion, sore throat, cough.  She reports mild shortness of breath with exertion today.  One episode of diarrhea yesterday; none today.  No rash, chest pain, vomiting, or other symptoms.  Treatment attempted with Mucinex and Robitussin; last taken yesterday; no OTC medications today.  Her medical history includes prediabetes.   The history is provided by the patient and medical records.    Past Medical History:  Diagnosis Date   Acne    Back pain    Prediabetes    Psoriasis    PTTD (posterior tibial tendon dysfunction)     Patient Active Problem List   Diagnosis Date Noted   Cough 12/22/2021   Vitamin D deficiency 03/19/2021   Psoriasis 03/18/2021   Prediabetes 03/18/2021   Numbness and tingling 03/18/2021   Left ankle tendonitis 03/18/2021    Past Surgical History:  Procedure Laterality Date   NO PAST SURGERIES      OB History   No obstetric history on file.      Home Medications    Prior to Admission medications   Medication Sig Start Date End Date Taking? Authorizing Provider  levonorgestrel (LILETTA, 52 MG,) 19.5 MCG/DAY IUD IUD by Intrauterine route.    [provider]  SKYRIZI PEN 150 MG/ML SOAJ Inject into the skin. 01/11/21   [provider]  azelastine (ASTELIN) 0.1 % nasal spray USE 1 SPRAY IN BOTH NOSTRILS TWICE A DAY 05/24/16 09/22/18  [provider]    Family History Family History  Problem Relation Age of Onset   Hyperlipidemia Mother    Hypertension Mother    Healthy Mother    Other Mother        prediabetes   Hyperlipidemia Father    Hypertension Father    Healthy Father    Hypertension Maternal Grandmother     Cancer Maternal Grandmother        not a smoker   Arthritis Maternal Grandmother    Diabetes Maternal Grandfather    Hypertension Maternal Grandfather    Hypertension Paternal Grandmother    Cancer Paternal Grandmother        multiple myeloma   Hypertension Paternal Grandfather     Social History Social History   Tobacco Use   Smoking status: Never   Smokeless tobacco: Never  Vaping Use   Vaping Use: Never used  Substance Use Topics   Alcohol use: Yes   Drug use: Never     Allergies   Patient has no known allergies.   Review of Systems Review of Systems  Constitutional:  Negative for chills and fever.  HENT:  Positive for congestion, sore throat and voice change. Negative for ear pain.   Respiratory:  Positive for cough and shortness of breath.   Cardiovascular:  Negative for chest pain and palpitations.  Gastrointestinal:  Positive for diarrhea. Negative for abdominal pain, nausea and vomiting.  Skin:  Negative for color change and rash.  All other systems reviewed and are negative.    Physical Exam Triage Vital Signs ED Triage Vitals  Enc Vitals Group     BP 12/22/21 1013 (!) 154/99  Pulse Rate 12/22/21 1013 97     Resp 12/22/21 1013 19     Temp 12/22/21 1013 99.4 F (37.4 C)     Temp src --      SpO2 12/22/21 1013 98 %     Weight 12/22/21 1012 140 lb (63.5 kg)     Height 12/22/21 1012 _0  (1.651 m)     Head Circumference --      Peak Flow --      Pain Score 12/22/21 1010 5     Pain Loc --      Pain Edu? --      Excl. in [place]? --    No data found.  Updated Vital Signs BP (!) 139/97   Pulse 97   Temp 99.4 F (37.4 C)   Resp 19   Ht _1  (1.651 m)   Wt 140 lb (63.5 kg)   SpO2 98%   BMI 23.30 kg/m   Visual Acuity Right Eye Distance:   Left Eye Distance:   Bilateral Distance:    Right Eye Near:   Left Eye Near:    Bilateral Near:     Physical Exam Vitals and nursing note reviewed.  Constitutional:      General: She is not in  acute distress.    Appearance: She is well-developed. She is not ill-appearing.  HENT:     Right Ear: Tympanic membrane normal.     Left Ear: Tympanic membrane normal.     Nose: Congestion and rhinorrhea present.     Mouth/Throat:     Mouth: Mucous membranes are moist.     Pharynx: Oropharynx is clear.  Cardiovascular:     Rate and Rhythm: Normal rate and regular rhythm.     Heart sounds: Normal heart sounds.  Pulmonary:     Effort: Pulmonary effort is normal. No respiratory distress.     Breath sounds: Normal breath sounds.  Abdominal:     General: Bowel sounds are normal.     Palpations: Abdomen is soft.     Tenderness: There is no abdominal tenderness. There is no guarding or rebound.  Musculoskeletal:     Cervical back: Neck supple.  Skin:    General: Skin is warm and dry.  Neurological:     Mental Status: She is alert.  Psychiatric:        Mood and Affect: Mood normal.        Behavior: Behavior normal.      UC Treatments / Results  Labs (all labs ordered are listed, but only abnormal results are displayed) Labs Reviewed  RESP PANEL BY RT-PCR (FLU A&B, COVID) [id number]    EKG   Radiology No results found.  Procedures Procedures (including critical care time)  Medications Ordered in UC Medications - No data to display  Initial Impression / Assessment and Plan / UC Course  I have reviewed the triage vital signs and the nursing notes.  Pertinent labs & imaging results that were available during my care of the patient were reviewed by me and considered in my medical decision making (see chart for details).    Cough, viral illness, Elevated blood pressure.  COVID and Flu pending.  Discussed symptomatic treatment including Tylenol or ibuprofen, rest, hydration.  Instructed patient to follow up with her PCP if symptoms are not improving.  Also discussed with patient that her blood pressure is elevated today and needs to be rechecked by PCP in 2 to 4 weeks.   Education provided on  preventing hypertension.  She agrees to plan of care.   Final Clinical Impressions(s) / UC Diagnoses   Final diagnoses:  Acute cough  Viral illness  Elevated blood pressure reading     Discharge Instructions      Your COVID and Flu tests are pending.    Take Tylenol or ibuprofen as needed for fever or discomfort.  Rest and keep yourself hydrated.    Follow-up with your primary care provider if your symptoms are not improving.    Your blood pressure is elevated today at 154/99; repeat 139/97.  Please have this rechecked by your primary care provider in 2-4 weeks.          ED Prescriptions   None    PDMP not reviewed this encounter.   Sharion Balloon, NP 12/22/21 1102

## 2021-12-22 NOTE — Discharge Instructions (Addendum)
Your COVID and Flu tests are pending.    Take Tylenol or ibuprofen as needed for fever or discomfort.  Rest and keep yourself hydrated.    Follow-up with your primary care provider if your symptoms are not improving.    Your blood pressure is elevated today at 154/99; repeat 139/97.  Please have this rechecked by your primary care provider in 2-4 weeks.

## 2021-12-22 NOTE — ED Triage Notes (Signed)
Patient to Urgent Care with complaints of  URI symptoms that started Friday night. Complaints of nasal congestion, and cough that is productive at times.   Possible fever last night.   Reports she has been taking mucinex and robitussin.

## 2022-03-03 ENCOUNTER — Telehealth: Payer: BC Managed Care – PPO | Admitting: Nurse Practitioner

## 2022-03-03 DIAGNOSIS — J329 Chronic sinusitis, unspecified: Secondary | ICD-10-CM | POA: Diagnosis not present

## 2022-03-03 DIAGNOSIS — B9789 Other viral agents as the cause of diseases classified elsewhere: Secondary | ICD-10-CM

## 2022-03-03 MED ORDER — FLUTICASONE PROPIONATE 50 MCG/ACT NA SUSP: 2.0000 | Freq: Every day | NASAL | 6 refills | Status: DC

## 2022-03-03 NOTE — Progress Notes (Signed)
E-Visit for Sinus Problems  We are sorry that you are not feeling well.  Here is how we plan to help!  Based on what you have shared with me it looks like you have sinusitis.  Sinusitis is inflammation and infection in the sinus cavities of the head.  Based on your presentation I believe you most likely have Acute Viral Sinusitis.This is an infection most likely caused by a virus. There is not specific treatment for viral sinusitis other than to help you with the symptoms until the infection runs its course.  You may use an oral decongestant such as Mucinex D or if you have glaucoma or high blood pressure use plain Mucinex. Saline nasal spray help and can safely be used as often as needed for congestion, I have prescribed: Fluticasone nasal spray two sprays in each nostril once a day  Providers prescribe antibiotics to treat infections caused by bacteria. Antibiotics are very powerful in treating bacterial infections when they are used properly. To maintain their effectiveness, they should be used only when necessary. Overuse of antibiotics has resulted in the development of superbugs that are resistant to treatment!    After careful review of your answers, I would not recommend an antibiotic for your condition.  Antibiotics are not effective against viruses and therefore should not be used to treat them. Common examples of infections caused by viruses include colds and flu   Some authorities believe that zinc sprays or the use of Echinacea may shorten the course of your symptoms.  Sinus infections are not as easily transmitted as other respiratory infection, however we still recommend that you avoid close contact with loved ones, especially the very young and elderly.  Remember to wash your hands thoroughly throughout the day as this is the number one way to prevent the spread of infection!  Home Care: Only take medications as instructed by your medical team. Do not take these medications with  alcohol. A steam or ultrasonic humidifier can help congestion.  You can place a towel over your head and breathe in the steam from hot water coming from a faucet. Avoid close contacts especially the very young and the elderly. Cover your mouth when you cough or sneeze. Always remember to wash your hands.  Get Help Right Away If: You develop worsening fever or sinus pain. You develop a severe head ache or visual changes. Your symptoms persist after you have completed your treatment plan.  Make sure you Understand these instructions. Will watch your condition. Will get help right away if you are not doing well or get worse.   Thank you for choosing an e-visit.  Your e-visit answers were reviewed by a board certified advanced clinical practitioner to complete your personal care plan. Depending upon the condition, your plan could have included both over the counter or prescription medications.  Please review your pharmacy choice. Make sure the pharmacy is open so you can pick up prescription now. If there is a problem, you may contact your provider through [employer] and have the prescription routed to another pharmacy.  Your safety is important to [place]. If you have drug allergies check your prescription carefully.   For the next 24 hours you can use MyChart to ask questions about today's visit, request a non-urgent call back, or ask for a work or school excuse. You will get an email in the next two days asking about your experience. I hope that your e-visit has been valuable and will speed your recovery.  Meds ordered this encounter  Medications   fluticasone (FLONASE) 50 MCG/ACT nasal spray    Sig: Place 2 sprays into both nostrils daily.    Dispense:  16 g    Refill:  6     I spent approximately 5 minutes reviewing the patient's history, current symptoms and coordinating their care today.

## 2022-03-05 NOTE — Progress Notes (Unsigned)
  Tomasita Morrow, NP-C Phone: [phone]  Monica Dunlap is a 32 y.o. female who presents today for transfer of care.     Social History   Tobacco Use  Smoking Status Never  Smokeless Tobacco Never    Current Outpatient Medications on File Prior to Visit  Medication Sig Dispense Refill   fluticasone (FLONASE) 50 MCG/ACT nasal spray Place 2 sprays into both nostrils daily. 16 g 6   levonorgestrel (LILETTA, 52 MG,) 19.5 MCG/DAY IUD IUD by Intrauterine route.     SKYRIZI PEN 150 MG/ML SOAJ Inject into the skin.     [DISCONTINUED] azelastine (ASTELIN) 0.1 % nasal spray USE 1 SPRAY IN BOTH NOSTRILS TWICE A DAY  1   No current facility-administered medications on file prior to visit.     ROS see history of present illness  Objective  Physical Exam There were no vitals filed for this visit.  BP Readings from Last 3 Encounters:  12/22/21 (!) 139/97  07/09/21 124/84  03/18/21 132/80   Wt Readings from Last 3 Encounters:  12/22/21 140 lb (63.5 kg)  07/09/21 142 lb 12.8 oz (64.8 kg)  03/18/21 137 lb 12.8 oz (62.5 kg)    Physical Exam   Assessment/Plan: Please see individual problem list.  There are no diagnoses linked to this encounter.   Health Maintenance: ***  No follow-ups on file.   Tomasita Morrow, NP-C [place]

## 2022-03-08 ENCOUNTER — Encounter: Payer: Self-pay | Admitting: Nurse Practitioner

## 2022-03-08 ENCOUNTER — Ambulatory Visit: Payer: BC Managed Care – PPO | Admitting: Nurse Practitioner

## 2022-03-08 VITALS — BP 118/80 | HR 116 | Temp 99.3°F | Ht 65.0 in | Wt 141.0 lb

## 2022-03-08 DIAGNOSIS — B3731 Acute candidiasis of vulva and vagina: Secondary | ICD-10-CM

## 2022-03-08 DIAGNOSIS — J029 Acute pharyngitis, unspecified: Secondary | ICD-10-CM

## 2022-03-08 DIAGNOSIS — L409 Psoriasis, unspecified: Secondary | ICD-10-CM | POA: Diagnosis not present

## 2022-03-08 DIAGNOSIS — J039 Acute tonsillitis, unspecified: Secondary | ICD-10-CM | POA: Diagnosis not present

## 2022-03-08 DIAGNOSIS — E559 Vitamin D deficiency, unspecified: Secondary | ICD-10-CM

## 2022-03-08 DIAGNOSIS — R7303 Prediabetes: Secondary | ICD-10-CM

## 2022-03-08 LAB — POCT RAPID STREP A (OFFICE): Rapid Strep A Screen: NEGATIVE

## 2022-03-08 MED ORDER — FLUCONAZOLE 150 MG PO TABS: ORAL_TABLET | ORAL | 0 refills | Status: DC

## 2022-03-08 MED ORDER — AMOXICILLIN 500 MG PO CAPS: 500.0000 mg | ORAL_CAPSULE | Freq: Three times a day (TID) | ORAL | 0 refills | Status: AC

## 2022-03-08 NOTE — Assessment & Plan Note (Signed)
Hx of yeast infections when taking antibiotics. Will sent in Diflucan for patient to have if needed while on antibiotic for tonsillitis.

## 2022-03-08 NOTE — Assessment & Plan Note (Addendum)
Strep A swab in office- negative. Symptoms and exam consistent with tonsillitis. Will treat with Amoxil 500 TID x 7 days. Counseled on side effects such as diarrhea. Advised to increase fluid intake, warm fluids may be soothing. Encouraged soft, liquid foods that are easy to swallow. OTC Tylenol/Ibuprofen PRN.

## 2022-03-08 NOTE — Assessment & Plan Note (Signed)
Chronic. Stable. Symptoms well controlled with Skyrizi. Continue. Follow up with Dermatology.

## 2022-03-08 NOTE — Assessment & Plan Note (Signed)
Stable with diet control. Last A1c- 5.8. Will check A1c at next appointment. Discussed decreasing pasta and bread intake. Continue with healthy diet and exercise.

## 2022-07-13 ENCOUNTER — Encounter: Payer: BC Managed Care – PPO | Admitting: Nurse Practitioner

## 2022-07-14 ENCOUNTER — Encounter: Payer: Self-pay | Admitting: Nurse Practitioner

## 2022-07-14 ENCOUNTER — Ambulatory Visit (INDEPENDENT_AMBULATORY_CARE_PROVIDER_SITE_OTHER): Payer: BC Managed Care – PPO | Admitting: Nurse Practitioner

## 2022-07-14 VITALS — BP 122/76 | HR 83 | Temp 99.3°F | Ht 65.0 in | Wt 145.6 lb

## 2022-07-14 DIAGNOSIS — B001 Herpesviral vesicular dermatitis: Secondary | ICD-10-CM | POA: Diagnosis not present

## 2022-07-14 DIAGNOSIS — E559 Vitamin D deficiency, unspecified: Secondary | ICD-10-CM | POA: Diagnosis not present

## 2022-07-14 DIAGNOSIS — Z13220 Encounter for screening for lipoid disorders: Secondary | ICD-10-CM

## 2022-07-14 DIAGNOSIS — R7303 Prediabetes: Secondary | ICD-10-CM

## 2022-07-14 DIAGNOSIS — Z114 Encounter for screening for human immunodeficiency virus [HIV]: Secondary | ICD-10-CM

## 2022-07-14 DIAGNOSIS — Z0000 Encounter for general adult medical examination without abnormal findings: Secondary | ICD-10-CM

## 2022-07-14 DIAGNOSIS — Z1159 Encounter for screening for other viral diseases: Secondary | ICD-10-CM

## 2022-07-14 DIAGNOSIS — Z1329 Encounter for screening for other suspected endocrine disorder: Secondary | ICD-10-CM

## 2022-07-14 NOTE — Progress Notes (Signed)
Bethanie Dicker, NP-C Phone: [phone]  Monica Dunlap is a 32 y.o. female who presents today for annual exam.   She noticed clusters of small blister like lesions that appeared on the left side of her upper lip approximately 5 days ago. They have been improving. They have dried and crusted over. She has been using Abreva and chapstick. Denies pain. They do not itch but do tingle. It has not spread. She has been out in the sun a lot recently, she was at the beach last week and [place] prior to that.   Diet: Good- increased fruits and vegetables, eats a lot of salads, likes pasta and rice Exercise: Pilates 2-3 times per week Pap smear: 07/02/2021- NILM/HPV+, Hx of abnormal paps, appt with Ob-Gyn on 07/21/2022 Family history-  Colon cancer: No  Breast cancer: No  Ovarian cancer: No Menses: IUD- light cycles/spotting Sexually active: Yes Vaccines-   Flu: Not due  Tetanus: 05/05/2017  COVID19: x 3 HIV screening: Today Hep C Screening: Today Tobacco use: No Alcohol use: Yes, glass of wine 3 nights per week Illicit Drug use: No Dentist: Yes Ophthalmology: Yes   Social History   Tobacco Use  Smoking Status Never  Smokeless Tobacco Never    Current Outpatient Medications on File Prior to Visit  Medication Sig Dispense Refill   cetirizine (ZYRTEC) 10 MG tablet Take 10 mg by mouth daily.     levonorgestrel (LILETTA, 52 MG,) 19.5 MCG/DAY IUD IUD by Intrauterine route.     SKYRIZI PEN 150 MG/ML SOAJ Inject into the skin.     fluticasone (FLONASE) 50 MCG/ACT nasal spray Place 2 sprays into both nostrils daily. (Patient taking differently: Place 2 sprays into both nostrils daily. Pt takes PRN during allergy season) 16 g 6   [DISCONTINUED] azelastine (ASTELIN) 0.1 % nasal spray USE 1 SPRAY IN BOTH NOSTRILS TWICE A DAY  1   No current facility-administered medications on file prior to visit.    ROS see history of present illness  Objective  Physical Exam Vitals:   07/14/22 1451   BP: 122/76  Pulse: 83  Temp: 99.3 F (37.4 C)  SpO2: 98%    BP Readings from Last 3 Encounters:  07/14/22 122/76  03/08/22 118/80  12/22/21 (!) 139/97   Wt Readings from Last 3 Encounters:  07/14/22 145 lb 9.6 oz (66 kg)  03/08/22 141 lb (64 kg)  12/22/21 140 lb (63.5 kg)    Physical Exam Constitutional:      General: She is not in acute distress.    Appearance: Normal appearance.  HENT:     Head: Normocephalic.     Right Ear: Tympanic membrane normal.     Left Ear: Tympanic membrane normal.     Nose: Nose normal.     Mouth/Throat:     Lips: Lesions present.     Mouth: Mucous membranes are moist.     Pharynx: Oropharynx is clear.      Comments: Cluster of very small, red blisters noted. Crusted over.  Eyes:     Conjunctiva/sclera: Conjunctivae normal.     Pupils: Pupils are equal, round, and reactive to light.  Neck:     Thyroid: No thyromegaly.  Cardiovascular:     Rate and Rhythm: Normal rate and regular rhythm.     Heart sounds: Normal heart sounds.  Pulmonary:     Effort: Pulmonary effort is normal.     Breath sounds: Normal breath sounds.  Abdominal:     General: Abdomen is flat.  Bowel sounds are normal.     Palpations: Abdomen is soft. There is no mass.     Tenderness: There is no abdominal tenderness.  Musculoskeletal:        General: Normal range of motion.  Lymphadenopathy:     Cervical: No cervical adenopathy.  Skin:    General: Skin is warm and dry.     Findings: No rash.  Neurological:     General: No focal deficit present.     Mental Status: She is alert.  Psychiatric:        Mood and Affect: Mood normal.        Behavior: Behavior normal.    Assessment/Plan: Please see individual problem list.  Preventative health care Assessment & Plan: Physical exam complete. Lab work as outlined. Will contact patient with results. Pap- due for repeat, abnormal in 2023. She has an appointment scheduled with her Ob-Gyn on 07/21/2022. Flu vaccine not  due. Tetanus vaccine- UTD. Declined additional COVID vaccines. HIV and Hep C screenings today in lab work. Recommended follow ups with Dentist and Ophthalmology for annual exams. Encouraged to continue healthy diet and exercise. Return to care in one year, sooner PRN.   Orders: -     CBC with Differential/Platelet -     Comprehensive metabolic panel  Cold sore Assessment & Plan: Area on left upper lip consistent with cold sore. Improving, drying and crusted over. Likely due to increased sun exposure. Encouraged to continue chapstick and abreva PRN. She will contact if worsening, changing or starts having frequent break outs. Will continue to monitor.    Prediabetes Assessment & Plan: Chronic. Stable with diet control. Last A1c- 5.8. Will check A1c today. Discussed decreasing pasta and rice intake. Encouraged to continue healthy diet and exercise.   Orders: -     Hemoglobin A1c  Vitamin D deficiency Assessment & Plan: Not taking OTC supplement. Will check vitamin D level today.   Orders: -     VITAMIN D 25 Hydroxy (Vit-D Deficiency, Fractures)  Lipid screening -     Lipid panel  Thyroid disorder screen -     TSH  Encounter for hepatitis C screening test for low risk patient -     Hepatitis C antibody  Encounter for screening for HIV -     HIV Antibody (routine testing w rflx)    Return in about 1 year (around 07/14/2023) for Annual Exam, sooner PRN.   Bethanie Dicker, NP-C [facility] - [employer]

## 2022-07-15 LAB — COMPREHENSIVE METABOLIC PANEL
ALT: 14 IU/L (ref 0–32)
AST: 19 IU/L (ref 0–40)
Albumin: 4.7 g/dL (ref 3.9–4.9)
Alkaline Phosphatase: 45 IU/L (ref 44–121)
BUN/Creatinine Ratio: 12 (ref 9–23)
BUN: 9 mg/dL (ref 6–20)
Bilirubin Total: 0.8 mg/dL (ref 0.0–1.2)
CO2: 24 mmol/L (ref 20–29)
Calcium: 9.7 mg/dL (ref 8.7–10.2)
Chloride: 104 mmol/L (ref 96–106)
Creatinine, Ser: 0.77 mg/dL (ref 0.57–1.00)
Globulin, Total: 2.5 g/dL (ref 1.5–4.5)
Glucose: 86 mg/dL (ref 70–99)
Potassium: 4.6 mmol/L (ref 3.5–5.2)
Sodium: 140 mmol/L (ref 134–144)
Total Protein: 7.2 g/dL (ref 6.0–8.5)
eGFR: 105 mL/min/{1.73_m2} (ref >59)

## 2022-07-15 LAB — LIPID PANEL
Chol/HDL Ratio: 3 ratio (ref 0.0–4.4)
Cholesterol, Total: 187 mg/dL (ref 100–199)
HDL: 63 mg/dL (ref >39)
LDL Chol Calc (NIH): 109 mg/dL — ABNORMAL HIGH (ref 0–99)
Triglycerides: 83 mg/dL (ref 0–149)
VLDL Cholesterol Cal: 15 mg/dL (ref 5–40)

## 2022-07-15 LAB — CBC WITH DIFFERENTIAL/PLATELET
Basophils Absolute: 0 10*3/uL (ref 0.0–0.2)
Basos: 0 %
EOS (ABSOLUTE): 0.3 10*3/uL (ref 0.0–0.4)
Eos: 3 %
Hematocrit: 35.9 % (ref 34.0–46.6)
Hemoglobin: 11.7 g/dL (ref 11.1–15.9)
Immature Grans (Abs): 0 10*3/uL (ref 0.0–0.1)
Immature Granulocytes: 0 %
Lymphocytes Absolute: 2.1 10*3/uL (ref 0.7–3.1)
Lymphs: 27 %
MCH: 27.8 pg (ref 26.6–33.0)
MCHC: 32.6 g/dL (ref 31.5–35.7)
MCV: 85 fL (ref 79–97)
Monocytes Absolute: 0.6 10*3/uL (ref 0.1–0.9)
Monocytes: 7 %
Neutrophils Absolute: 4.8 10*3/uL (ref 1.4–7.0)
Neutrophils: 63 %
Platelets: 314 10*3/uL (ref 150–450)
RBC: 4.21 x10E6/uL (ref 3.77–5.28)
RDW: 13.6 % (ref 11.7–15.4)
WBC: 7.8 10*3/uL (ref 3.4–10.8)

## 2022-07-15 LAB — HEMOGLOBIN A1C
Est. average glucose Bld gHb Est-mCnc: 120 mg/dL
Hgb A1c MFr Bld: 5.8 % — ABNORMAL HIGH (ref 4.8–5.6)

## 2022-07-15 LAB — HEPATITIS C ANTIBODY: Hepatitis C Ab: NONREACTIVE

## 2022-07-15 LAB — TSH: TSH: 1.75 u[IU]/mL (ref 0.450–4.500)

## 2022-07-15 LAB — VITAMIN D 25 HYDROXY (VIT D DEFICIENCY, FRACTURES): Vit D, 25-Hydroxy: 31.5 ng/mL (ref 30.0–100.0)

## 2022-07-15 LAB — HIV ANTIBODY (ROUTINE TESTING W REFLEX): HIV 1&2 Ab, 4th Generation: NONREACTIVE

## 2022-07-16 ENCOUNTER — Encounter: Payer: Self-pay | Admitting: Nurse Practitioner

## 2022-07-16 DIAGNOSIS — B001 Herpesviral vesicular dermatitis: Secondary | ICD-10-CM | POA: Insufficient documentation

## 2022-07-16 NOTE — Assessment & Plan Note (Signed)
Not taking OTC supplement. Will check vitamin D level today.

## 2022-07-16 NOTE — Assessment & Plan Note (Signed)
Physical exam complete. Lab work as outlined. Will contact patient with results. Pap- due for repeat, abnormal in 2023. She has an appointment scheduled with her Ob-Gyn on 07/21/2022. Flu vaccine not due. Tetanus vaccine- UTD. Declined additional COVID vaccines. HIV and Hep C screenings today in lab work. Recommended follow ups with Dentist and Ophthalmology for annual exams. Encouraged to continue healthy diet and exercise. Return to care in one year, sooner PRN.

## 2022-07-16 NOTE — Assessment & Plan Note (Addendum)
Chronic. Stable with diet control. Last A1c- 5.8. Will check A1c today. Discussed decreasing pasta and rice intake. Encouraged to continue healthy diet and exercise.

## 2022-07-16 NOTE — Assessment & Plan Note (Addendum)
Area on left upper lip consistent with cold sore. Improving, drying and crusted over. Likely due to increased sun exposure. Encouraged to continue chapstick and abreva PRN. She will contact if worsening, changing or starts having frequent break outs. Will continue to monitor.

## 2022-09-21 ENCOUNTER — Telehealth: Payer: Self-pay

## 2022-09-22 ENCOUNTER — Telehealth: Payer: Self-pay | Admitting: Nurse Practitioner

## 2022-09-22 NOTE — Telephone Encounter (Signed)
Pt called stating there was a lab bill that was coded wrong someone from the office told her to call her insurance company. So she called her insurance company and they told her that the lab was coded wrong. So she call the office back and they told her to call [facility]. Pt called [facility] and they could not do anything about the bill because it was [facility]. Pt called called  [facility] and they just need the correct coding tor the vitamin D to be re billed. Pt stated it need to be coded as non-wellness

## 2022-09-22 NOTE — Telephone Encounter (Signed)
I spoke with the patient and informed her that her labs were coded the same as with Dr. French Ana . She stated she would call her insurance carrier to follow up.

## 2022-11-09 NOTE — Telephone Encounter (Signed)
Error

## 2023-07-20 ENCOUNTER — Encounter: Payer: BC Managed Care – PPO | Admitting: Nurse Practitioner

## 2024-02-20 IMAGING — MR MR ANKLE*L* W/O CM
4 of 6 series · 16 of 40 positions shown · non-contrast
Comparison: None.

CLINICAL DATA: Ankle pain, tendon abnormality suspected, neg xray
Evaluate posterior tibial tendon tear left - surgical consideration

EXAM:
MRI OF THE LEFT ANKLE WITHOUT CONTRAST
TECHNIQUE: Multiplanar, multisequence MR imaging of the ankle was performed. No
intravenous contrast was administered.

[Series 4: T2 fat-sat · axial · 3.0mm · 0.50mm/px · z∈[-59,+19]mm · 3 of 32 slices shown (1 of 2)]
[im 6/32]
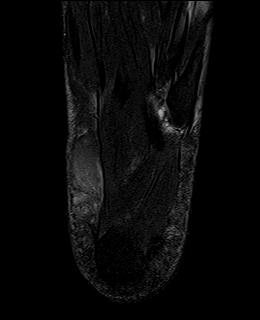
[im 16/32]
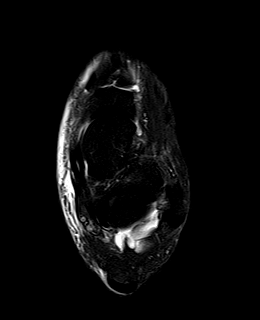
[im 26/32]
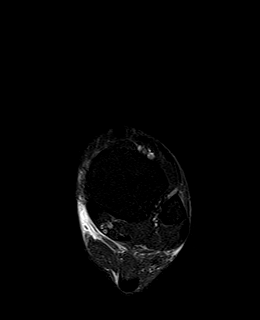

[Series 5: PD fat-sat · axial · 3.0mm · 0.21mm/px · z∈[-79,+42]mm · 7 of 32 slices shown]
[im 1/32]
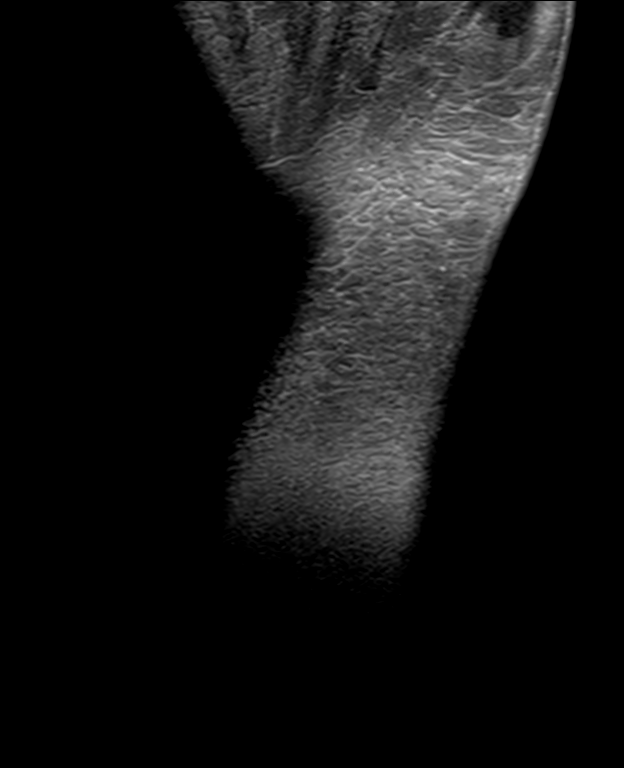
[im 6/32]
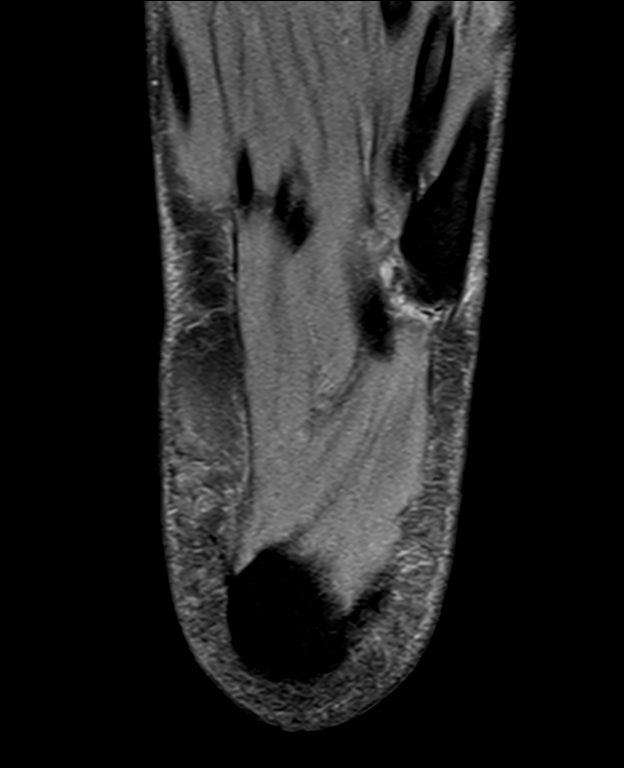
[im 11/32]
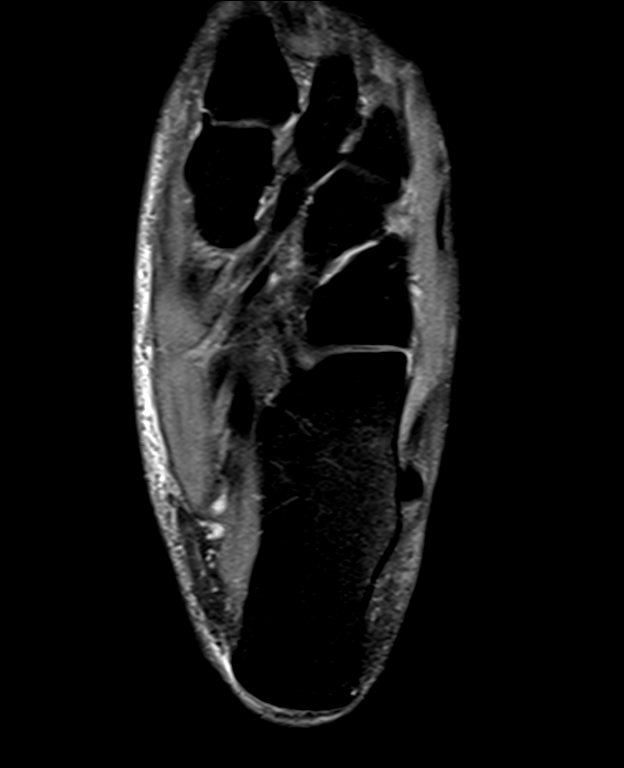
[im 16/32]
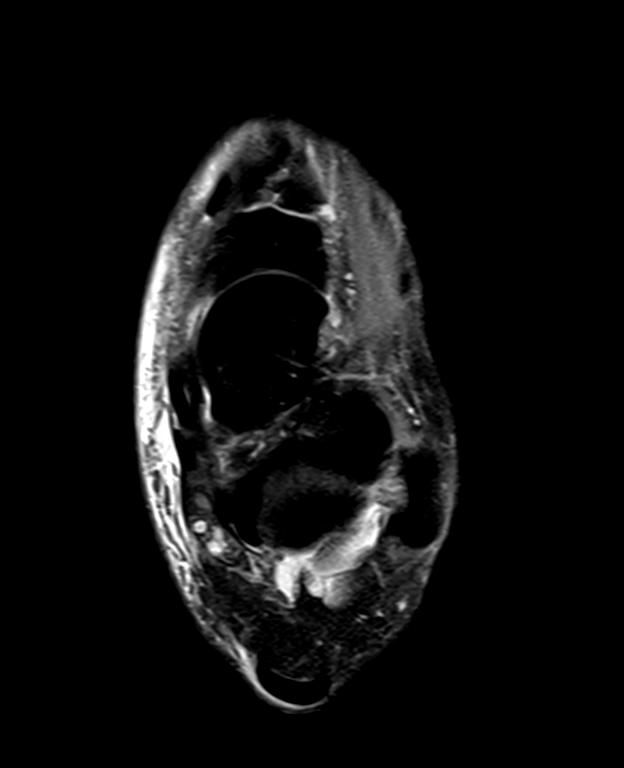
[im 21/32]
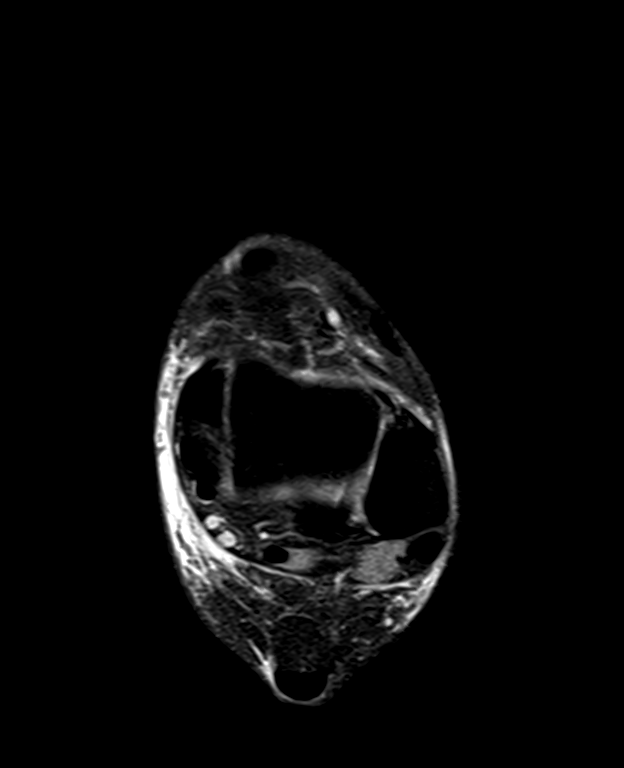
[im 26/32]
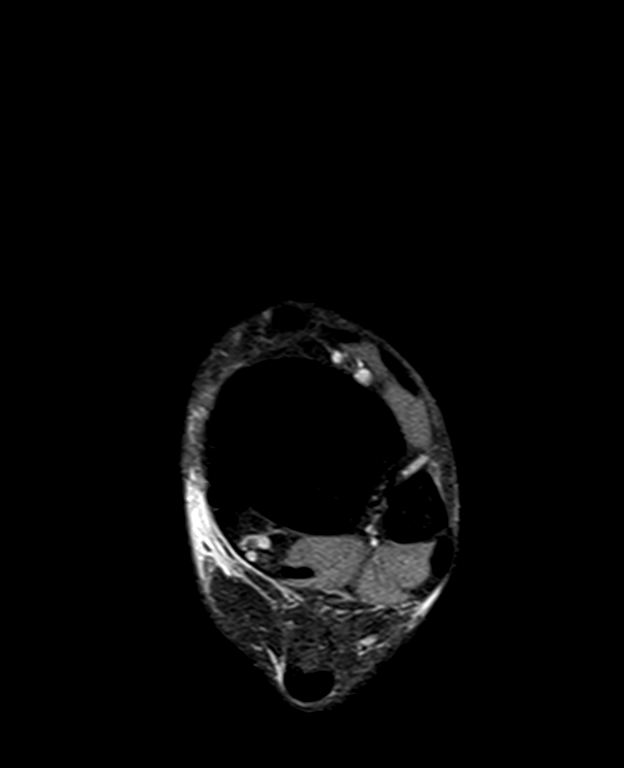
[im 32/32]
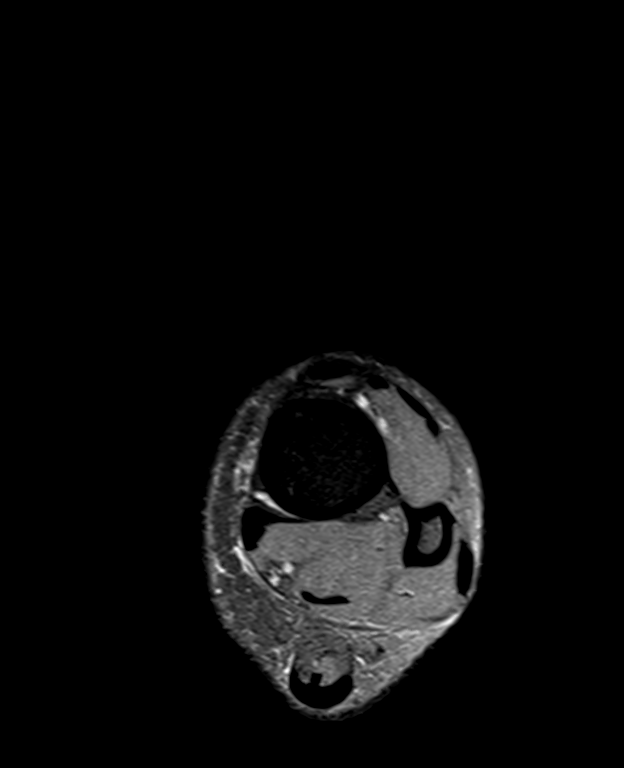

[Series 6: T1 · sagittal · 4.0mm · 0.25mm/px · 3 of 20 slices shown]
[im 1/20]
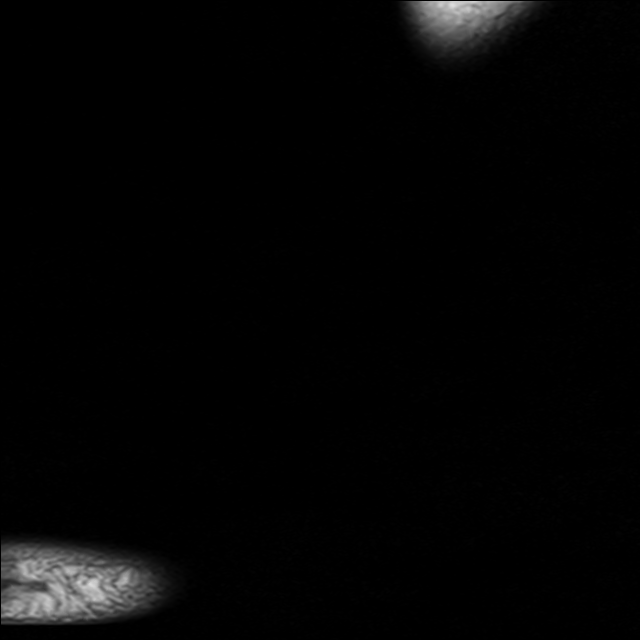
[im 10/20]
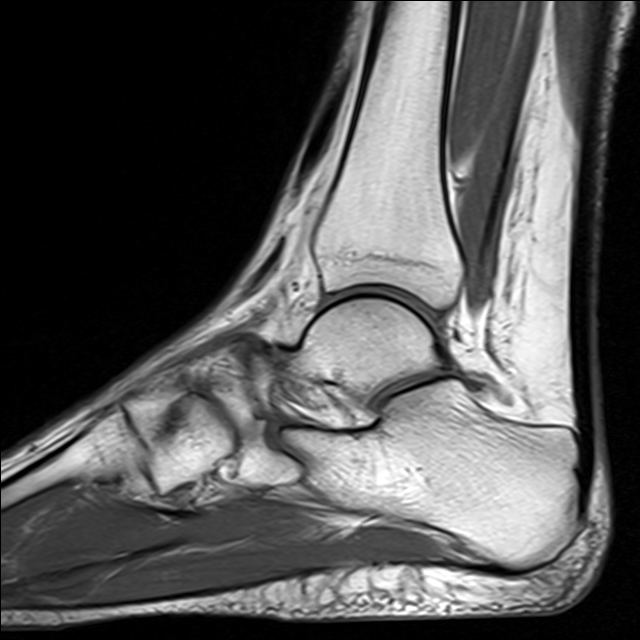
[im 20/20]
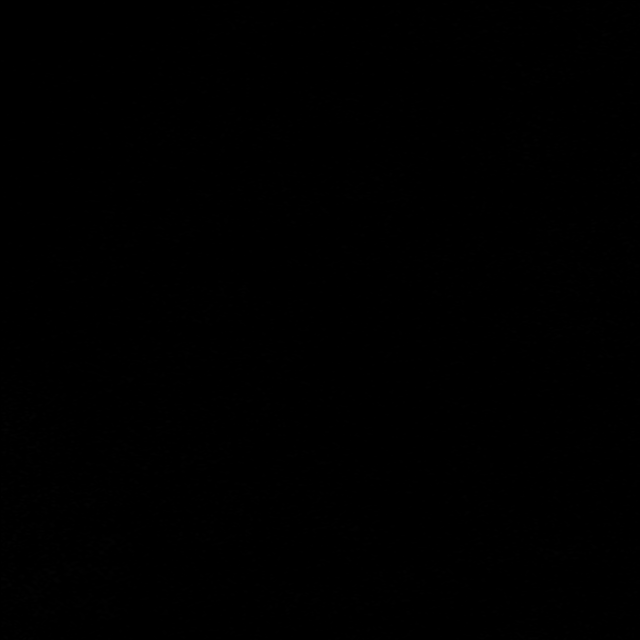

[Series 8: T2 fat-sat · coronal · 3.0mm · 0.50mm/px · 3 of 32 slices shown (2 of 2)]
[im 5/32]
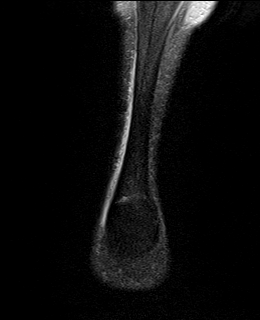
[im 18/32]
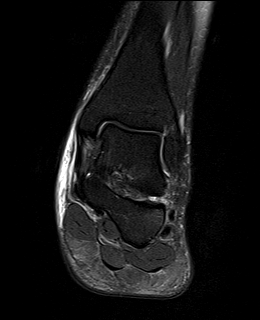
[im 27/32]
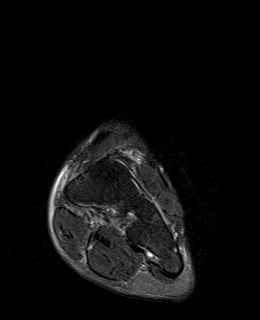

[16 of 40 positions shown; findings below may reference images not displayed]

FINDINGS: TENDONS

Peroneal: Intact peroneus longus and peroneus brevis tendons.

Posteromedial: There is minimal tenosynovitis of the posterior
tibial tendon below the medial malleolus at the level of the talar
neck and as it passes the beneath the medial malleolus. There is no
acute PT tendon tear. Intact flexor hallucis longus and flexor
digitorum longus.

Anterior: Intact tibialis anterior, extensor hallucis longus and
extensor digitorum longus tendons.

Achilles: Intact.

Plantar Fascia: Intact.

LIGAMENTS

Lateral: Anterior talofibular ligament intact. Calcaneofibular
ligament intact. Posterior talofibular ligament intact. Anterior and
posterior tibiofibular ligaments intact.

Medial: Deltoid ligament intact. Spring ligament intact.

CARTILAGE

Ankle Joint: No joint effusion. Normal ankle mortise. No chondral
defect.

Subtalar Joints/Sinus Tarsi: Normal subtalar joints. No subtalar
joint effusion. Normal sinus tarsi.

Bones: No marrow signal abnormality.  No fracture or dislocation.

Soft Tissue: There is subcutaneous edema along the medial ankle. No
fluid collection or hematoma. Muscles are normal without edema or
atrophy. Tarsal tunnel is normal.
IMPRESSION: Subcutaneous edema along the medial ankle. No focal fluid
collection.

Minimal tenosynovitis of the posterior tibial tendon at the level of
the talar head/neck and as a passes beneath the medial malleolus. No
acute tendon tear.

Intact ankle ligaments.
